# Patient Record
Sex: Female | Born: 1970 | Race: Asian | Hispanic: No | Marital: Married | Smoking: Never smoker
Health system: Southern US, Community
[De-identification: ages and names within clinical notes are randomized; demographics above are authoritative.]

## PROBLEM LIST (undated history)

## (undated) DIAGNOSIS — E8881 Metabolic syndrome: Secondary | ICD-10-CM

## (undated) DIAGNOSIS — D6861 Antiphospholipid syndrome: Secondary | ICD-10-CM

## (undated) DIAGNOSIS — O24419 Gestational diabetes mellitus in pregnancy, unspecified control: Secondary | ICD-10-CM

## (undated) DIAGNOSIS — IMO0001 Reserved for inherently not codable concepts without codable children: Secondary | ICD-10-CM

## (undated) DIAGNOSIS — N858 Other specified noninflammatory disorders of uterus: Secondary | ICD-10-CM

## (undated) DIAGNOSIS — N979 Female infertility, unspecified: Secondary | ICD-10-CM

## (undated) DIAGNOSIS — E88819 Insulin resistance, unspecified: Secondary | ICD-10-CM

## (undated) DIAGNOSIS — O039 Complete or unspecified spontaneous abortion without complication: Secondary | ICD-10-CM

## (undated) DIAGNOSIS — I10 Essential (primary) hypertension: Secondary | ICD-10-CM

## (undated) HISTORY — DX: Female infertility, unspecified: N97.9

## (undated) HISTORY — DX: Antiphospholipid syndrome: D68.61

## (undated) HISTORY — DX: Gestational diabetes mellitus in pregnancy, unspecified control: O24.419

## (undated) HISTORY — DX: Complete or unspecified spontaneous abortion without complication: O03.9

## (undated) HISTORY — DX: Essential (primary) hypertension: I10

## (undated) HISTORY — DX: Other specified noninflammatory disorders of uterus: N85.8

## (undated) HISTORY — DX: Metabolic syndrome: E88.81

## (undated) HISTORY — PX: OTHER SURGICAL HISTORY: SHX169

## (undated) HISTORY — DX: Insulin resistance, unspecified: E88.819

## (undated) SURGERY — Surgical Case
Anesthesia: Regional

---

## 2008-11-06 HISTORY — PX: DILATION AND CURETTAGE OF UTERUS: SHX78

## 2008-11-19 ENCOUNTER — Ambulatory Visit (HOSPITAL_COMMUNITY): Admission: RE | Admit: 2008-11-19 | Discharge: 2008-11-19 | Payer: Self-pay | Admitting: Obstetrics and Gynecology

## 2008-11-19 ENCOUNTER — Ambulatory Visit: Payer: Self-pay | Admitting: Vascular Surgery

## 2008-11-19 ENCOUNTER — Encounter (INDEPENDENT_AMBULATORY_CARE_PROVIDER_SITE_OTHER): Payer: Self-pay | Admitting: Obstetrics and Gynecology

## 2008-12-02 ENCOUNTER — Ambulatory Visit (HOSPITAL_COMMUNITY): Admission: RE | Admit: 2008-12-02 | Discharge: 2008-12-02 | Payer: Self-pay | Admitting: Obstetrics and Gynecology

## 2008-12-02 ENCOUNTER — Encounter (INDEPENDENT_AMBULATORY_CARE_PROVIDER_SITE_OTHER): Payer: Self-pay | Admitting: Obstetrics and Gynecology

## 2010-03-09 ENCOUNTER — Ambulatory Visit: Payer: Self-pay | Admitting: Cardiology

## 2010-04-28 ENCOUNTER — Ambulatory Visit: Payer: Self-pay | Admitting: Cardiology

## 2010-05-06 ENCOUNTER — Ambulatory Visit: Payer: Self-pay | Admitting: Oncology

## 2010-06-09 ENCOUNTER — Ambulatory Visit: Payer: Self-pay | Admitting: *Deleted

## 2010-06-16 ENCOUNTER — Other Ambulatory Visit (HOSPITAL_COMMUNITY): Payer: Self-pay | Admitting: Obstetrics and Gynecology

## 2010-06-16 DIAGNOSIS — N979 Female infertility, unspecified: Secondary | ICD-10-CM

## 2010-06-21 ENCOUNTER — Ambulatory Visit (HOSPITAL_COMMUNITY)
Admission: RE | Admit: 2010-06-21 | Discharge: 2010-06-21 | Disposition: A | Discharge status: Home / Self Care | Payer: PRIVATE HEALTH INSURANCE | Source: Ambulatory Visit | Attending: Obstetrics and Gynecology | Admitting: Obstetrics and Gynecology

## 2010-06-21 DIAGNOSIS — N96 Recurrent pregnancy loss: Secondary | ICD-10-CM | POA: Insufficient documentation

## 2010-06-21 DIAGNOSIS — N979 Female infertility, unspecified: Secondary | ICD-10-CM

## 2010-07-26 ENCOUNTER — Encounter (HOSPITAL_COMMUNITY)
Admission: RE | Admit: 2010-07-26 | Discharge: 2010-07-26 | Disposition: A | Discharge status: Home / Self Care | Payer: PRIVATE HEALTH INSURANCE | Source: Ambulatory Visit | Attending: Obstetrics and Gynecology | Admitting: Obstetrics and Gynecology

## 2010-07-26 DIAGNOSIS — Z01812 Encounter for preprocedural laboratory examination: Secondary | ICD-10-CM | POA: Insufficient documentation

## 2010-07-26 LAB — BASIC METABOLIC PANEL
Chloride: 104 mEq/L (ref 96–112)
Creatinine, Ser: 0.63 mg/dL (ref 0.4–1.2)
GFR calc Af Amer: >60 mL/min (ref >60)
Potassium: 3.7 mEq/L (ref 3.5–5.1)
Sodium: 137 mEq/L (ref 135–145)

## 2010-07-26 LAB — CBC
Hemoglobin: 13.9 g/dL (ref 12.0–15.0)
Platelets: 269 10*3/uL (ref 150–400)
RBC: 4.66 MIL/uL (ref 3.87–5.11)
WBC: 6.7 10*3/uL (ref 4.0–10.5)

## 2010-07-30 ENCOUNTER — Ambulatory Visit (HOSPITAL_COMMUNITY)
Admission: RE | Admit: 2010-07-30 | Discharge: 2010-07-30 | Disposition: A | Discharge status: Home / Self Care | Payer: PRIVATE HEALTH INSURANCE | Source: Ambulatory Visit | Attending: Obstetrics and Gynecology | Admitting: Obstetrics and Gynecology

## 2010-07-30 DIAGNOSIS — N840 Polyp of corpus uteri: Secondary | ICD-10-CM | POA: Insufficient documentation

## 2010-07-30 DIAGNOSIS — Q5128 Other doubling of uterus, other specified: Secondary | ICD-10-CM | POA: Insufficient documentation

## 2010-07-30 LAB — HCG, QUANTITATIVE, PREGNANCY: hCG, Beta Chain, Quant, S: <2 m[IU]/mL (ref <5)

## 2010-08-02 ENCOUNTER — Other Ambulatory Visit: Payer: Self-pay | Admitting: Obstetrics and Gynecology

## 2010-08-10 ENCOUNTER — Encounter: Payer: Self-pay | Admitting: Nurse Practitioner

## 2010-08-10 DIAGNOSIS — I10 Essential (primary) hypertension: Secondary | ICD-10-CM | POA: Insufficient documentation

## 2010-08-12 ENCOUNTER — Ambulatory Visit (INDEPENDENT_AMBULATORY_CARE_PROVIDER_SITE_OTHER): Payer: PRIVATE HEALTH INSURANCE | Admitting: Nurse Practitioner

## 2010-08-12 ENCOUNTER — Encounter: Payer: Self-pay | Admitting: Cardiology

## 2010-08-12 ENCOUNTER — Encounter: Payer: Self-pay | Admitting: Nurse Practitioner

## 2010-08-12 VITALS — BP 140/90 | HR 90 | Ht 60.0 in | Wt 120.8 lb

## 2010-08-12 DIAGNOSIS — I10 Essential (primary) hypertension: Secondary | ICD-10-CM

## 2010-08-12 DIAGNOSIS — Z79899 Other long term (current) drug therapy: Secondary | ICD-10-CM

## 2010-08-12 MED ORDER — LABETALOL HCL 100 MG PO TABS: 100.0000 mg | ORAL_TABLET | Freq: Every day | ORAL | Status: DC

## 2010-08-12 NOTE — Assessment & Plan Note (Signed)
Blood pressure is still not at goal. She is going to start IVF this month. I have discussed her case with Dr. Deborah Chalk. We will stop the HCTZ. We will place her on Labetolol 100mg  each day. I have encouraged regular exercise and sodium restriction. I will see her back in 1 month.

## 2010-08-12 NOTE — Progress Notes (Signed)
History of Present Illness: Alyssa Dennis is seen back today for a follow up visit. She is seen for Dr. Deborah Chalk. She has been feeling ok from our standpoint. She has had follow up with Dr. Gaylyn Rong. A repeat test showed that she was negative for the antiphospholipid antibody syndrome. There is no need for anticoagulation. She is going to proceed with IVF later this month. She needs to stop the HCTZ. Her blood pressure at home remains borderline. She is trying to watch her salt intake. She is exercising some.  No current outpatient prescriptions on file prior to visit.    No Known Allergies  Past Medical History  Diagnosis Date  . HTN (hypertension)   . Anti-cardiolipin antibody syndrome     Negative per retesting by Dr. Gaylyn Rong  . Miscarriage     x 2  . Uterine cyst   . Infertility, female     Past Surgical History  Procedure Date  . Dilation and curettage of uterus July 2010    History  Smoking status  . Never Smoker   Smokeless tobacco  . Never Used    History  Alcohol Use No    Family History  Problem Relation Age of Onset  . Hypertension Mother   . Hypertension Father     Review of Systems: The review of systems is positive for some dizziness after taking her meds.  All other systems were reviewed and are negative.  Physical Exam: BP 140/90  Pulse 90  Ht 5' (1.524 m)  Wt 120 lb 12.8 oz (54.795 kg)  BMI 23.59 kg/m2  LMP 08/01/2010 Patient is very pleasant and in no acute distress. Skin is warm and dry. Color is normal.  HEENT is unremarkable. Normocephalic/atraumatic. PERRL. Sclera are nonicteric. Neck is supple. No masses. No JVD. Lungs are clear. Cardiac exam shows a regular rate and rhythm. Abdomen is soft. Extremities are without edema. Gait and ROM are intact. No gross neurologic deficits noted.    Assessment / Plan:

## 2010-08-12 NOTE — Patient Instructions (Signed)
Stop the HCTZ We are going to start you on Labetolol 100mg  daily. Monitor your blood pressure at home.  Record your readings and bring to your next visit. Limit sodium intake. I will see you in one month.

## 2010-08-15 LAB — CBC
HCT: 39.3 % (ref 36.0–46.0)
Hemoglobin: 13.5 g/dL (ref 12.0–15.0)
MCHC: 34.4 g/dL (ref 30.0–36.0)
MCV: 89.6 fL (ref 78.0–100.0)
Platelets: 336 K/uL (ref 150–400)
RBC: 4.39 MIL/uL (ref 3.87–5.11)
RDW: 12.9 % (ref 11.5–15.5)
WBC: 8.4 K/uL (ref 4.0–10.5)

## 2010-08-17 ENCOUNTER — Telehealth: Payer: Self-pay | Admitting: Cardiology

## 2010-08-17 NOTE — Telephone Encounter (Signed)
CALL PT REGARDING HER BP MED AND SOME EFFECTS FROM THIS.

## 2010-08-23 ENCOUNTER — Telehealth: Payer: Self-pay | Admitting: Cardiology

## 2010-08-23 ENCOUNTER — Telehealth: Payer: Self-pay | Admitting: *Deleted

## 2010-08-23 NOTE — Telephone Encounter (Signed)
Calling back regarding her blood pressure;  She stopped it and is wondering if she needs another medicine?  Chart is on Dr. Molli Posey desk; pt was advised that he is out of the office and would be called tomorrow.

## 2010-08-23 NOTE — Telephone Encounter (Signed)
Returning a phone call. Please call back.

## 2010-08-23 NOTE — Telephone Encounter (Signed)
Left mess. For pt to call back regarding her call about her blood pressure medication

## 2010-08-24 ENCOUNTER — Other Ambulatory Visit: Payer: Self-pay | Admitting: *Deleted

## 2010-08-24 ENCOUNTER — Telehealth: Payer: Self-pay | Admitting: *Deleted

## 2010-08-24 DIAGNOSIS — I10 Essential (primary) hypertension: Secondary | ICD-10-CM

## 2010-08-24 MED ORDER — METHYLDOPA 250 MG PO TABS: 250.0000 mg | ORAL_TABLET | Freq: Two times a day (BID) | ORAL | Status: DC

## 2010-08-24 NOTE — Telephone Encounter (Signed)
Pt called, was unable to take Labetalol due to itching that the medication caused.  Dr. Deborah Chalk suggested pt monitor BP for a few days and call back with results.  Pt called back BP am 134/90 in the pm 145/98.   Pt is still having fertility treatments.  Dr. Deborah Chalk notified and instructed RN to have pt try Aldomet 250 mg BID.  Pt notified and verbalized to RN understanding of instructions.

## 2010-09-06 ENCOUNTER — Encounter: Payer: Self-pay | Admitting: Nurse Practitioner

## 2010-09-13 ENCOUNTER — Ambulatory Visit (INDEPENDENT_AMBULATORY_CARE_PROVIDER_SITE_OTHER): Payer: PRIVATE HEALTH INSURANCE | Admitting: Nurse Practitioner

## 2010-09-13 ENCOUNTER — Encounter: Payer: Self-pay | Admitting: Nurse Practitioner

## 2010-09-13 VITALS — BP 120/80 | HR 100 | Wt 122.0 lb

## 2010-09-13 DIAGNOSIS — I10 Essential (primary) hypertension: Secondary | ICD-10-CM

## 2010-09-13 NOTE — Assessment & Plan Note (Signed)
Her blood pressure looks better to me. I don't think she will tolerate an increase in her medicines and for now I don't think she needs it. Her blood pressure comes back down with her second dose of the Aldomet. She will continue to monitor her blood pressure at home. I will have her see Dr. Antoine Poche for a visit in about 3 months in light of Dr. Deborah Chalk retiring next month. We will see where we are with the IVF and if she is pregnant. Hopefully we could defer further management to primary care after her pregnancy.  Patient is agreeable to this plan and will call if any problems develop in the interim.

## 2010-09-13 NOTE — Progress Notes (Signed)
   Alyssa Dennis Date of Birth: 1971/02/24   History of Present Illness: Alyssa Dennis is seen today for a follow up visit. It is a 4 week check. She is seen for Dr. Deborah Chalk. She is doing well. She did not tolerate Labetolol. She had itching. She has been switched to Aldomet. She is doing IVF treatments to try and get pregnant. She is doing ok. She is trying to watch her salt. Her blood pressure at home is better with the Aldomet. She notes that her readings to trend up in the afternoon but come back down with her second dose of medicine. She has had readings as low as 110 systolic.   Current Outpatient Prescriptions on File Prior to Visit  Medication Sig Dispense Refill  . aspirin 81 MG tablet Take 81 mg by mouth daily.        . metFORMIN (GLUCOPHAGE-XR) 750 MG 24 hr tablet Take 750 mg by mouth daily with breakfast.        . methyldopa (ALDOMET) 250 MG tablet Take 1 tablet (250 mg total) by mouth 2 (two) times daily.  60 tablet  3  . Multiple Vitamin (MULTIVITAMIN) tablet Take 1 tablet by mouth daily.        Marland Kitchen DISCONTD: labetalol (NORMODYNE) 100 MG tablet Take 1 tablet (100 mg total) by mouth daily.  30 tablet  11    Allergies  Allergen Reactions  . Labetalol Itching    Past Medical History  Diagnosis Date  . HTN (hypertension)   . Anti-cardiolipin antibody syndrome     Negative per retesting by Dr. Gaylyn Rong  . Miscarriage     x 2  . Uterine cyst   . Infertility, female     Past Surgical History  Procedure Date  . Dilation and curettage of uterus July 2010    History  Smoking status  . Never Smoker   Smokeless tobacco  . Never Used    History  Alcohol Use No    Family History  Problem Relation Age of Onset  . Hypertension Mother   . Hypertension Father     Review of Systems: The review of systems is positive for occasional back pain. She does not have chest pain. She is not short of breath.  All other systems were reviewed and are negative.  Physical Exam: BP  120/80  Pulse 100  Wt 122 lb (55.339 kg) Patient is very pleasant and in no acute distress. Skin is warm and dry. Color is normal.  HEENT is unremarkable. Normocephalic/atraumatic. PERRL. Sclera are nonicteric. Neck is supple. No masses. No JVD. Lungs are clear. Cardiac exam shows a regular rate and rhythm. Abdomen is soft. Extremities are without edema. Gait and ROM are intact. No gross neurologic deficits noted.  LABORATORY DATA:  N/A   Assessment / Plan:

## 2010-09-13 NOTE — Patient Instructions (Signed)
Continue with your current medicines. Monitor your blood pressure at home.  Record your readings and bring to your next visit. Limit sodium intake. Call for any problems. I will have you see Dr. Antoine Poche in about 3 months.

## 2010-09-21 NOTE — Op Note (Signed)
[name], [name]       ACCOUNT NO.:  [id number]   MEDICAL RECORD NO.:  [id number]          PATIENT TYPE:  AMB   LOCATION:  [facility]                           FACILITY:  [facility]   PHYSICIAN:  Lenoard Aden, M.D.DATE OF BIRTH:  July 10, 1970   DATE OF PROCEDURE:  12/02/2008  DATE OF DISCHARGE:  12/02/2008                               OPERATIVE REPORT   PREOPERATIVE DIAGNOSIS:  Missed abortion at 7 weeks.   POSTOPERATIVE DIAGNOSIS:  Missed abortion at 7 weeks.   PROCEDURE:  Suction, dilatation and evacuation.   SURGEON:  Lenoard Aden, MD   ANESTHESIA:  MAC paracervical.   ESTIMATED BLOOD LOSS:  Less than 50 mL.   COMPLICATIONS:  None.   DRAINS:  None.   COUNTS:  Correct.  The patient recovery in good condition.   BRIEF OPERATIVE NOTE:  After being apprised of risks of anesthesia,  infection, bleeding, injury to abdominal organs, need for repair,  delayed versus immediate complications to include bowel and bladder  injury, the patient was brought to the operating room where she was  administered IV sedation without difficulty, prepped and draped in usual  sterile fashion, catheterized until the bladder was empty.  Exam under  anesthesia reveals an anteflexed uterus and no adnexal masses.  Cervix  was easily dilated up to [facility] dilator after placement of dilute  paracervical block with 20 mL total.  At this time, the suction curette  was placed.  Visualization and aspiration in a 4 quadrant method reveals  products of conception.  Repeat curettage in a 4 quadrant method and  suction reveals the cavity to be empty.  Good hemostasis noted.  All  instruments removed.  The patient tolerates the procedure well and went  to recovery in good condition.      Lenoard Aden, M.D.  Electronically Signed     RJT/MEDQ  D:  12/02/2008  T:  12/03/2008  Job:  [id number]

## 2010-10-15 ENCOUNTER — Telehealth: Payer: Self-pay | Admitting: *Deleted

## 2010-10-15 NOTE — Telephone Encounter (Signed)
Pt called requesting a note to take to school stating pt needed rest due to hypertension.  Pt is currently pregnant and on Aldomet for hypertension.  Dr. Deborah Chalk notified and instructed RN to have pt get note from OB/GYN.  Pt notified of Dr. Ronnald Nian instructions and verbalized to RN understanding of instructions.

## 2010-11-04 ENCOUNTER — Inpatient Hospital Stay (HOSPITAL_COMMUNITY)
Admission: AD | Admit: 2010-11-04 | Payer: PRIVATE HEALTH INSURANCE | Source: Ambulatory Visit | Admitting: Obstetrics and Gynecology

## 2010-11-04 LAB — GC/CHLAMYDIA PROBE AMP, GENITAL: Chlamydia: NEGATIVE

## 2010-11-08 LAB — HIV ANTIBODY (ROUTINE TESTING W REFLEX): HIV: NONREACTIVE

## 2010-11-08 LAB — RPR: RPR: NONREACTIVE

## 2010-11-08 LAB — ANTIBODY SCREEN: Antibody Screen: NEGATIVE

## 2010-11-08 LAB — HEPATITIS B SURFACE ANTIGEN: Hepatitis B Surface Ag: NEGATIVE

## 2010-12-10 NOTE — Op Note (Signed)
  NAMEErion Dennis, Alyssa Dennis       ACCOUNT NO.:  [id number]  MEDICAL RECORD NO.:  [id number]           PATIENT TYPE:  O  LOCATION:  [facility]                          [facility]  PHYSICIAN:  Fermin Schwab, MD   DATE OF BIRTH:  1970/10/10  DATE OF PROCEDURE:  07/30/2010 DATE OF DISCHARGE:                              OPERATIVE REPORT   PREOPERATIVE DIAGNOSES:  Endometrial polyp and partial uterine septum.  POSTOPERATIVE DIAGNOSES:  Endometrial polyp and partial uterine septum.  PROCEDURES:  Hysteroscopy, incision of uterine septum, and polypectomy.  SURGEON:  Fermin Schwab, MD  ANESTHESIA:  General.  SPECIMENS:  Endometrial polyp to Pathology.  COMPLICATIONS:  None.  ESTIMATED BLOOD LOSS:  Minimal.  DESCRIPTION OF FINDINGS:  On hysteroscopy, the endocervical canal was normal.  The endometrial cavity appears to be sharply anteverted and anteflexed.  The endometrial cavity contained a polypoid ridge on the right mid uterine cavity, was measured 0.5 x 1 cm.  There were no other polyps.  The uterine fundus appears to have a wide septum that came down about 1.2 cm.  DESCRIPTION OF PROCEDURE:  The patient was placed in dorsal lithotomy position.  General anesthesia was given, 1 gram of Kefzol was given intravenously for prophylaxis.  She was prepped and draped in sterile manner.  A dilute vasopressin solution was injected into the cervix after the bladder was straight catheterized.  A 30-degree standard hysteroscope was inserted with some difficulty through the cervical canal into the endometrial cavity.  Above findings were noted.  The distention solution was normal.  Saline distention method was hysteroscopic fluid management system.  Using hysteroscopic scissors, first the polypoid tissue on the right side of the uterus was excised.  Next, the partial septum was incised for about 1 cm.  Excellent hemostasis was achieved.  Instruments were removed.  The pad count was  correct.  Estimated blood loss was minimal. The patient tolerated the procedure well and was transferred to recovery room in satisfactory condition.     Fermin Schwab, MD     TY/MEDQ  D:  07/30/2010  T:  07/31/2010  Job:  [id number]  Electronically Signed by Fermin Schwab MD on 12/10/2010 04:11:06 PM

## 2010-12-21 ENCOUNTER — Ambulatory Visit (INDEPENDENT_AMBULATORY_CARE_PROVIDER_SITE_OTHER): Payer: PRIVATE HEALTH INSURANCE | Admitting: Cardiology

## 2010-12-21 ENCOUNTER — Ambulatory Visit: Payer: PRIVATE HEALTH INSURANCE | Admitting: Cardiology

## 2010-12-21 ENCOUNTER — Encounter: Payer: Self-pay | Admitting: Cardiology

## 2010-12-21 VITALS — BP 118/66 | HR 94 | Resp 16 | Ht 59.0 in | Wt 121.0 lb

## 2010-12-21 DIAGNOSIS — I10 Essential (primary) hypertension: Secondary | ICD-10-CM

## 2010-12-21 MED ORDER — METHYLDOPA 250 MG PO TABS: 250.0000 mg | ORAL_TABLET | Freq: Two times a day (BID) | ORAL | Status: DC

## 2010-12-21 NOTE — Patient Instructions (Signed)
The current medical regimen is effective;  continue present plan and medications.  Follow up with Dr Hochrein in 4 months. 

## 2010-12-21 NOTE — Progress Notes (Signed)
HPI The patient presents for followup of hypertension. She is now 4 months pregnant. On her current regimen her blood pressure has been well-controlled. There have been rare systolics above 140. She is feeling well. The patient denies any new symptoms such as chest discomfort, neck or arm discomfort. There has been no new shortness of breath, PND or orthopnea. There have been no reported palpitations, presyncope or syncope.   Allergies  Allergen Reactions  . Labetalol Itching    Current Outpatient Prescriptions  Medication Sig Dispense Refill  . aspirin 81 MG tablet Take 81 mg by mouth daily.        . metFORMIN (GLUCOPHAGE-XR) 750 MG 24 hr tablet Take 750 mg by mouth daily with breakfast.        . methyldopa (ALDOMET) 250 MG tablet Take 1 tablet (250 mg total) by mouth 2 (two) times daily.  60 tablet  3  . Multiple Vitamin (MULTIVITAMIN) tablet Take 1 tablet by mouth daily.          Past Medical History  Diagnosis Date  . HTN (hypertension)   . Anti-cardiolipin antibody syndrome     Negative per retesting by Dr. Gaylyn Rong  . Miscarriage     x 2  . Uterine cyst   . Infertility, female     Past Surgical History  Procedure Date  . Dilation and curettage of uterus July 2010    ROS:  As stated in the HPI and negative for all other systems.  PHYSICAL EXAM BP 118/66  Pulse 94  Resp 16  Ht 4\' 11"  (1.499 m)  Wt 121 lb (54.885 kg)  BMI 24.44 kg/m2 GENERAL:  Well appearing HEENT:  Pupils equal round and reactive, fundi not visualized, oral mucosa unremarkable NECK:  No jugular venous distention, waveform within normal limits, carotid upstroke brisk and symmetric, no bruits, no thyromegaly LYMPHATICS:  No cervical, inguinal adenopathy LUNGS:  Clear to auscultation bilaterally BACK:  No CVA tenderness CHEST:  Unremarkable HEART:  PMI not displaced or sustained,S1 and S2 within normal limits, no S3, no S4, no clicks, no rubs, no murmurs ABD:  Flat, positive bowel sounds normal in  frequency in pitch, no bruits, no rebound, no guarding, no midline pulsatile mass, no hepatomegaly, no splenomegaly, gravid EXT:  2 plus pulses throughout, no edema, no cyanosis no clubbing SKIN:  No rashes no nodules NEURO:  Cranial nerves II through XII grossly intact, motor grossly intact throughout PSYCH:  Cognitively intact, oriented to person place and time   EKG:  Sinus rhythm, right axis, rate 94, no acute ST-T wave changes  ASSESSMENT AND PLAN

## 2010-12-21 NOTE — Assessment & Plan Note (Signed)
At this point her blood pressure is well controlled on Methyldopa.  No change in therapy is indicated.  We talked about a low salt diet.  No further work up is indicated.  She will let me know if her blood pressure starts to increase.

## 2011-03-08 ENCOUNTER — Encounter: Payer: PRIVATE HEALTH INSURANCE | Attending: Obstetrics and Gynecology | Admitting: Dietician

## 2011-03-08 ENCOUNTER — Encounter: Payer: Self-pay | Admitting: Dietician

## 2011-03-08 DIAGNOSIS — O99810 Abnormal glucose complicating pregnancy: Secondary | ICD-10-CM | POA: Insufficient documentation

## 2011-03-08 DIAGNOSIS — Z713 Dietary counseling and surveillance: Secondary | ICD-10-CM | POA: Insufficient documentation

## 2011-03-08 NOTE — Progress Notes (Signed)
  Patient was seen on 03/08/2011 for Gestational Diabetes self-management class at the [facility]. The following learning objectives were met by the patient during this course:   States the definition of Gestational Diabetes  States why dietary management is important in controlling blood glucose  Describes the effects each nutrient has on blood glucose levels  Demonstrates ability to create a balanced meal plan  Demonstrates carbohydrate counting   States when to check blood glucose levels  Demonstrates proper blood glucose monitoring techniques  States the effect of stress and exercise on blood glucose levels  States the importance of limiting caffeine and abstaining from alcohol and smoking  Blood glucose monitor given: Accu-Chek [employer] Kit Lot # [id number] Exp: 05/08/2012 Blood glucose reading: 79 Note that 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  I introduced them to the Reli- On meter from [employer] as an alternative to the Accu-Chek.  Given they give a history of a limited budget, the Reli-On meter and supplies Alyssa Dennis cost less than the co-pay for the Accu-Chek.  Patient instructed to monitor glucose levels:Fasting and 2 hours after first bite of each meal. FBS: 60 - <90 1 hour: <140 2 hour: <120  *Patient received handouts:  Nutrition Diabetes and Pregnancy  Carbohydrate Counting List  Patient will be seen for follow-up as needed.

## 2011-04-22 ENCOUNTER — Ambulatory Visit: Payer: PRIVATE HEALTH INSURANCE | Admitting: Cardiology

## 2011-04-25 ENCOUNTER — Other Ambulatory Visit: Payer: Self-pay | Admitting: Cardiology

## 2011-04-25 DIAGNOSIS — I10 Essential (primary) hypertension: Secondary | ICD-10-CM

## 2011-04-25 MED ORDER — METHYLDOPA 250 MG PO TABS: 250.0000 mg | ORAL_TABLET | Freq: Two times a day (BID) | ORAL | Status: DC

## 2011-04-29 ENCOUNTER — Encounter: Payer: Self-pay | Admitting: Cardiology

## 2011-04-29 ENCOUNTER — Ambulatory Visit (INDEPENDENT_AMBULATORY_CARE_PROVIDER_SITE_OTHER): Payer: PRIVATE HEALTH INSURANCE | Admitting: Cardiology

## 2011-04-29 VITALS — BP 108/68 | HR 80 | Ht 59.0 in | Wt 142.8 lb

## 2011-04-29 DIAGNOSIS — I10 Essential (primary) hypertension: Secondary | ICD-10-CM

## 2011-04-29 NOTE — Patient Instructions (Signed)
Continue current medications as listed.  Follow up with Dr Antoine Poche in 8 weeks

## 2011-04-29 NOTE — Assessment & Plan Note (Signed)
Her blood pressure is controlled. Before she takes her medications her systolics are in the 1:30 range. Therefore, I think she needs to continue with the current dose. I did tell her that after delivery I would be happy to see her back to readjust medications in case her blood pressure is lower or higher. Also I would probably not use methyldopa long-term.

## 2011-04-29 NOTE — Progress Notes (Signed)
   HPI The patient presents for followup of hypertension. She is now [redacted] weeks pregnant. Her meds are as listed. Her blood pressure has been controlled. It does fluctuate from systolics of 108-130. She's not had any presyncope or syncope. She's had no chest pressure, neck or arm discomfort. She's had no shortness of breath, PND or orthopnea. She's had no new weight gain or edema other than associated with pregnancy. Her blood sugars have been elevated.   Allergies  Allergen Reactions  . Labetalol Itching    Current Outpatient Prescriptions  Medication Sig Dispense Refill  . metFORMIN (GLUCOPHAGE-XR) 750 MG 24 hr tablet Take 750 mg by mouth daily with breakfast.        . methyldopa (ALDOMET) 250 MG tablet Take 1 tablet (250 mg total) by mouth 2 (two) times daily.  60 tablet  3  . Prenatal Vit-Fe Fumarate-FA (PRENATAL VITAMINS PLUS PO) Take 1 tablet by mouth daily.          Past Medical History  Diagnosis Date  . HTN (hypertension)   . Anti-cardiolipin antibody syndrome     Negative per retesting by Dr. Gaylyn Rong  . Miscarriage     x 2  . Uterine cyst   . Infertility, female     Past Surgical History  Procedure Date  . Dilation and curettage of uterus July 2010    ROS:  As stated in the HPI and negative for all other systems.  PHYSICAL EXAM BP 108/68  Pulse 80  Ht 4\' 11"  (1.499 m)  Wt 142 lb 12.8 oz (64.774 kg)  BMI 28.84 kg/m2  LMP 08/01/2010 GENERAL:  Well appearing HEENT:  Pupils equal round and reactive, fundi not visualized, oral mucosa unremarkable NECK:  No jugular venous distention, waveform within normal limits, carotid upstroke brisk and symmetric, no bruits, no thyromegaly LYMPHATICS:  No cervical, inguinal adenopathy LUNGS:  Clear to auscultation bilaterally BACK:  No CVA tenderness CHEST:  Unremarkable HEART:  PMI not displaced or sustained,S1 and S2 within normal limits, no S3, no S4, no clicks, no rubs, no murmurs ABD:  Flat, positive bowel sounds normal in  frequency in pitch, no bruits, no rebound, no guarding, no midline pulsatile mass, no hepatomegaly, no splenomegaly, gravid (exam is compromised by this) EXT:  2 plus pulses throughout, no edema, no cyanosis no clubbing SKIN:  No rashes no nodules NEURO:  Cranial nerves II through XII grossly intact, motor grossly intact throughout PSYCH:  Cognitively intact, oriented to person place and time  ASSESSMENT AND PLAN

## 2011-05-20 ENCOUNTER — Telehealth (HOSPITAL_COMMUNITY): Payer: Self-pay | Admitting: *Deleted

## 2011-05-20 ENCOUNTER — Encounter (HOSPITAL_COMMUNITY): Payer: Self-pay | Admitting: *Deleted

## 2011-05-20 NOTE — Telephone Encounter (Signed)
Preadmission screen  

## 2011-05-26 ENCOUNTER — Telehealth (HOSPITAL_COMMUNITY): Payer: Self-pay | Admitting: *Deleted

## 2011-05-26 NOTE — Telephone Encounter (Signed)
Preadmission screen  

## 2011-05-27 ENCOUNTER — Inpatient Hospital Stay (HOSPITAL_COMMUNITY)
Admission: RE | Admit: 2011-05-27 | Discharge: 2011-05-31 | DRG: 765 | Disposition: A | Discharge status: Home / Self Care | Payer: PRIVATE HEALTH INSURANCE | Source: Ambulatory Visit | Attending: Obstetrics and Gynecology | Admitting: Obstetrics and Gynecology

## 2011-05-27 ENCOUNTER — Encounter (HOSPITAL_COMMUNITY): Payer: Self-pay

## 2011-05-27 DIAGNOSIS — O365990 Maternal care for other known or suspected poor fetal growth, unspecified trimester, not applicable or unspecified: Secondary | ICD-10-CM | POA: Diagnosis present

## 2011-05-27 DIAGNOSIS — IMO0001 Reserved for inherently not codable concepts without codable children: Secondary | ICD-10-CM

## 2011-05-27 DIAGNOSIS — O09519 Supervision of elderly primigravida, unspecified trimester: Secondary | ICD-10-CM | POA: Diagnosis present

## 2011-05-27 DIAGNOSIS — O1002 Pre-existing essential hypertension complicating childbirth: Secondary | ICD-10-CM | POA: Diagnosis present

## 2011-05-27 DIAGNOSIS — I10 Essential (primary) hypertension: Secondary | ICD-10-CM

## 2011-05-27 DIAGNOSIS — O99814 Abnormal glucose complicating childbirth: Secondary | ICD-10-CM | POA: Diagnosis present

## 2011-05-27 HISTORY — DX: Reserved for inherently not codable concepts without codable children: IMO0001

## 2011-05-27 LAB — CBC
HCT: 36.9 % (ref 36.0–46.0)
Hemoglobin: 13 g/dL (ref 12.0–15.0)
MCV: 89.1 fL (ref 78.0–100.0)
Platelets: 197 10*3/uL (ref 150–400)
RBC: 4.14 MIL/uL (ref 3.87–5.11)
WBC: 8.6 10*3/uL (ref 4.0–10.5)

## 2011-05-27 LAB — COMPREHENSIVE METABOLIC PANEL
AST: 17 U/L (ref 0–37)
Albumin: 2.9 g/dL — ABNORMAL LOW (ref 3.5–5.2)
Alkaline Phosphatase: 115 U/L (ref 39–117)
BUN: 10 mg/dL (ref 6–23)
CO2: 24 mEq/L (ref 19–32)
Chloride: 102 mEq/L (ref 96–112)
Potassium: 3.9 mEq/L (ref 3.5–5.1)
Total Bilirubin: 0.2 mg/dL — ABNORMAL LOW (ref 0.3–1.2)

## 2011-05-27 MED ORDER — DINOPROSTONE 10 MG VA INST
10.0000 mg | VAGINAL_INSERT | Freq: Once | VAGINAL | Status: AC
  Administered 2011-05-27: 10 mg via VAGINAL
  Filled 2011-05-27: qty 1

## 2011-05-27 MED ORDER — ZOLPIDEM TARTRATE 10 MG PO TABS: 10.0000 mg | ORAL_TABLET | Freq: Every evening | ORAL | Status: DC | PRN

## 2011-05-27 MED ORDER — TERBUTALINE SULFATE 1 MG/ML IJ SOLN: 0.2500 mg | Freq: Once | INTRAMUSCULAR | Status: AC | PRN

## 2011-05-27 MED ORDER — ACETAMINOPHEN 325 MG PO TABS
650.0000 mg | ORAL_TABLET | ORAL | Status: DC | PRN
Start: 2011-05-27 — End: 2011-05-28

## 2011-05-27 MED ORDER — OXYTOCIN BOLUS FROM INFUSION
500.0000 mL | Freq: Once | INTRAVENOUS | Status: DC
  Filled 2011-05-27: qty 500

## 2011-05-27 MED ORDER — OXYCODONE-ACETAMINOPHEN 5-325 MG PO TABS: 2.0000 | ORAL_TABLET | ORAL | Status: DC | PRN

## 2011-05-27 MED ORDER — METHYLDOPA 250 MG PO TABS
250.0000 mg | ORAL_TABLET | Freq: Two times a day (BID) | ORAL | Status: DC
  Administered 2011-05-28 – 2011-05-29 (×2): 250 mg via ORAL
  Filled 2011-05-27 (×3): qty 1

## 2011-05-27 MED ORDER — OXYTOCIN 20 UNITS IN LACTATED RINGERS INFUSION - SIMPLE
1.0000 m[IU]/min | INTRAVENOUS | Status: DC
  Administered 2011-05-28: 2 m[IU]/min via INTRAVENOUS
  Filled 2011-05-27: qty 1000

## 2011-05-27 MED ORDER — FLEET ENEMA 7-19 GM/118ML RE ENEM: 1.0000 | ENEMA | RECTAL | Status: DC | PRN

## 2011-05-27 MED ORDER — LACTATED RINGERS IV SOLN
500.0000 mL | INTRAVENOUS | Status: DC | PRN
  Administered 2011-05-28: 1000 mL via INTRAVENOUS

## 2011-05-27 MED ORDER — LIDOCAINE HCL (PF) 1 % IJ SOLN: 30.0000 mL | INTRAMUSCULAR | Status: DC | PRN

## 2011-05-27 MED ORDER — CITRIC ACID-SODIUM CITRATE 334-500 MG/5ML PO SOLN
30.0000 mL | ORAL | Status: DC | PRN
  Administered 2011-05-28: 30 mL via ORAL
  Filled 2011-05-27: qty 15

## 2011-05-27 MED ORDER — IBUPROFEN 600 MG PO TABS: 600.0000 mg | ORAL_TABLET | Freq: Four times a day (QID) | ORAL | Status: DC | PRN

## 2011-05-27 MED ORDER — OXYTOCIN 20 UNITS IN LACTATED RINGERS INFUSION - SIMPLE: 125.0000 mL/h | Freq: Once | INTRAVENOUS | Status: DC

## 2011-05-27 MED ORDER — LACTATED RINGERS IV SOLN
INTRAVENOUS | Status: DC
  Administered 2011-05-27: 125 mL/h via INTRAVENOUS

## 2011-05-27 MED ORDER — ONDANSETRON HCL 4 MG/2ML IJ SOLN: 4.0000 mg | Freq: Four times a day (QID) | INTRAMUSCULAR | Status: DC | PRN

## 2011-05-27 NOTE — Progress Notes (Signed)
Alyssa Dennis is a 41 y.o. G3P0020 at [redacted]w[redacted]d by LMP admitted for induction of labor due to Gestational diabetes, Hypertension and Poor fetal growth.  Subjective: No complaints  Objective: BP 139/89  Pulse 111  Temp(Src) 97.7 F (36.5 C) (Oral)  Resp 18  Ht 4\' 11"  (1.499 m)  Wt 67.586 kg (149 lb)  BMI 30.09 kg/m2  LMP 08/01/2010      FHT:  FHR: 155 bpm, variability: moderate,  accelerations:  Present,  decelerations:  Absent UC:   none SVE:    closed/60/-1 Cervidil placed  Labs: Lab Results  Component Value Date   WBC 8.6 05/27/2011   HGB 13.0 05/27/2011   HCT 36.9 05/27/2011   MCV 89.1 05/27/2011   PLT 197 05/27/2011    Assessment / Plan: 39 weeks CHTN IUGR GDM on Metformin  Labor: Cervidil tonight and Pitocin in am Preeclampsia:  labs stable and pending Fetal Wellbeing:  Category I Pain Control:  Labor support without medications I/D:  n/a Anticipated MOD:  Attempted SVD, likely Cesarean section  Khalon Cansler J 05/27/2011, 9:24 PM

## 2011-05-27 NOTE — Progress Notes (Signed)
MD will be at the bedside to see pt.

## 2011-05-27 NOTE — H&P (Signed)
NAMEMarzelle, Rutten Albertia       ACCOUNT NO.:  [id number]  MEDICAL RECORD NO.:  [id number]  LOCATION:  9173                          FACILITY:  [facility]  PHYSICIAN:  Lenoard Aden, M.D.DATE OF BIRTH:  1970-08-19  DATE OF ADMISSION:  05/27/2011 DATE OF DISCHARGE:                             HISTORY & PHYSICAL   CHIEF COMPLAINT:  IUGR and chronic hypertension, for induction of labor.  HISTORY OF PRESENT ILLNESS:  She is a 41 year old [place] female, G1, P0, at 38-6/[redacted] weeks gestation who presents for cervical ripening, and attempts at vaginal delivery due to aforementioned indications.  She has allergies to labetalol.  MEDICATIONS:  Prenatal vitamins, Aldomet, metformin.  She is a nonsmoker, nondrinker.  Denies domestic or physical violence.  She has a personal medical history of previously diagnosed borderline anticardiolipin antibody, but otherwise negative workup, a history of infertility, and infused with fertility assistance.  She has a history of hypertension and is on __________ noted.  Family history, which is noncontributory.  PREGNANCY HISTORY:  Contributory for an SAB and a missed AB, one with D and C, prenatal course complicated by size and date discrepancy.  Growth restriction with estimated fetal weight under the 10th percentile and chronic hypertension well controlled on Aldomet and gestational diabetes, diet and medication controlled on metformin.  PHYSICAL EXAMINATION:  GENERAL:  She is a well-developed, well-nourished [place] female in no acute distress. HEENT:  Normal. NECK:  Supple.  Full range of motion. LUNGS:  Clear. HEART:  Regular rhythm. ABDOMEN:  Soft, gravid, nontender.  Estimated fetal weight 5-1/2 to 6 pounds.  Cervix is closed, 50%, vertex, -1, -2. EXTREMITIES:  There are no cords. NEUROLOGIC:  Nonfocal. SKIN:  Intact.  LABORATORY DATA:  Pending.  NST is pending.  Cervidil pending.  IMPRESSION: 1. A 38-6/7 week intrauterine pregnancy. 2.  Asymmetric intrauterine growth restriction. 3. Gestational diabetes, on metformin. 4. Chronic hypertension, on Aldomet.  PLAN:  Admit Cervidil.  Continue Aldomet.  Check CBG q.6.  Check CMP, CBC, anticipate cautious attempts at vaginal delivery.     Lenoard Aden, M.D.     RJT/MEDQ  D:  05/27/2011  T:  05/27/2011  Job:  [id number]

## 2011-05-28 ENCOUNTER — Encounter (HOSPITAL_COMMUNITY): Payer: Self-pay

## 2011-05-28 ENCOUNTER — Encounter (HOSPITAL_COMMUNITY)
Admission: RE | Disposition: A | Discharge status: Home / Self Care | Payer: Self-pay | Source: Ambulatory Visit | Attending: Obstetrics and Gynecology

## 2011-05-28 ENCOUNTER — Inpatient Hospital Stay (HOSPITAL_COMMUNITY): Payer: PRIVATE HEALTH INSURANCE | Admitting: Anesthesiology

## 2011-05-28 ENCOUNTER — Other Ambulatory Visit: Payer: Self-pay | Admitting: Obstetrics and Gynecology

## 2011-05-28 ENCOUNTER — Encounter (HOSPITAL_COMMUNITY): Payer: Self-pay | Admitting: Anesthesiology

## 2011-05-28 DIAGNOSIS — IMO0001 Reserved for inherently not codable concepts without codable children: Secondary | ICD-10-CM

## 2011-05-28 HISTORY — DX: Reserved for inherently not codable concepts without codable children: IMO0001

## 2011-05-28 LAB — GLUCOSE, CAPILLARY
Glucose-Capillary: 89 mg/dL (ref 70–99)
Glucose-Capillary: 73 mg/dL (ref 70–99)

## 2011-05-28 SURGERY — Surgical Case
Anesthesia: Spinal | Site: Abdomen | Wound class: Clean Contaminated

## 2011-05-28 MED ORDER — KETOROLAC TROMETHAMINE 30 MG/ML IJ SOLN
30.0000 mg | Freq: Four times a day (QID) | INTRAMUSCULAR | Status: AC | PRN
  Filled 2011-05-28: qty 1

## 2011-05-28 MED ORDER — WITCH HAZEL-GLYCERIN EX PADS: 1.0000 "application " | MEDICATED_PAD | CUTANEOUS | Status: DC | PRN

## 2011-05-28 MED ORDER — ZOLPIDEM TARTRATE 5 MG PO TABS: 5.0000 mg | ORAL_TABLET | Freq: Every evening | ORAL | Status: DC | PRN

## 2011-05-28 MED ORDER — KETOROLAC TROMETHAMINE 60 MG/2ML IM SOLN
60.0000 mg | Freq: Once | INTRAMUSCULAR | Status: AC | PRN
  Administered 2011-05-28: 60 mg via INTRAMUSCULAR

## 2011-05-28 MED ORDER — KETOROLAC TROMETHAMINE 30 MG/ML IJ SOLN: 30.0000 mg | Freq: Four times a day (QID) | INTRAMUSCULAR | Status: AC | PRN

## 2011-05-28 MED ORDER — IBUPROFEN 600 MG PO TABS
600.0000 mg | ORAL_TABLET | Freq: Four times a day (QID) | ORAL | Status: DC | PRN
  Administered 2011-05-31: 600 mg via ORAL
  Filled 2011-05-28 (×6): qty 1

## 2011-05-28 MED ORDER — MORPHINE SULFATE (PF) 0.5 MG/ML IJ SOLN
INTRAMUSCULAR | Status: DC | PRN
  Administered 2011-05-28: .1 mg via EPIDURAL

## 2011-05-28 MED ORDER — ONDANSETRON HCL 4 MG/2ML IJ SOLN
INTRAMUSCULAR | Status: AC
  Filled 2011-05-28: qty 2

## 2011-05-28 MED ORDER — ONDANSETRON HCL 4 MG/2ML IJ SOLN: 4.0000 mg | INTRAMUSCULAR | Status: DC | PRN

## 2011-05-28 MED ORDER — DIPHENHYDRAMINE HCL 25 MG PO CAPS: 25.0000 mg | ORAL_CAPSULE | Freq: Four times a day (QID) | ORAL | Status: DC | PRN

## 2011-05-28 MED ORDER — ONDANSETRON HCL 4 MG/2ML IJ SOLN
INTRAMUSCULAR | Status: DC | PRN
  Administered 2011-05-28: 4 mg via INTRAVENOUS

## 2011-05-28 MED ORDER — METHYLDOPA 250 MG PO TABS
250.0000 mg | ORAL_TABLET | Freq: Two times a day (BID) | ORAL | Status: DC
  Administered 2011-05-29 – 2011-05-31 (×5): 250 mg via ORAL
  Filled 2011-05-28 (×5): qty 1

## 2011-05-28 MED ORDER — TETANUS-DIPHTH-ACELL PERTUSSIS 5-2.5-18.5 LF-MCG/0.5 IM SUSP: 0.5000 mL | Freq: Once | INTRAMUSCULAR | Status: DC

## 2011-05-28 MED ORDER — PROMETHAZINE HCL 25 MG/ML IJ SOLN: 6.2500 mg | INTRAMUSCULAR | Status: DC | PRN

## 2011-05-28 MED ORDER — SCOPOLAMINE 1 MG/3DAYS TD PT72
MEDICATED_PATCH | TRANSDERMAL | Status: AC
  Administered 2011-05-28: 1.5 mg via TRANSDERMAL
  Filled 2011-05-28: qty 1

## 2011-05-28 MED ORDER — SIMETHICONE 80 MG PO CHEW
80.0000 mg | CHEWABLE_TABLET | Freq: Three times a day (TID) | ORAL | Status: DC
  Administered 2011-05-28 – 2011-05-31 (×9): 80 mg via ORAL

## 2011-05-28 MED ORDER — OXYTOCIN 20 UNITS IN LACTATED RINGERS INFUSION - SIMPLE
125.0000 mL/h | INTRAVENOUS | Status: DC
  Administered 2011-05-28: 125 mL/h via INTRAVENOUS

## 2011-05-28 MED ORDER — PHENYLEPHRINE 40 MCG/ML (10ML) SYRINGE FOR IV PUSH (FOR BLOOD PRESSURE SUPPORT)
PREFILLED_SYRINGE | INTRAVENOUS | Status: AC
  Filled 2011-05-28: qty 10

## 2011-05-28 MED ORDER — SENNOSIDES-DOCUSATE SODIUM 8.6-50 MG PO TABS
2.0000 | ORAL_TABLET | Freq: Every day | ORAL | Status: DC
  Administered 2011-05-28 – 2011-05-30 (×3): 2 via ORAL

## 2011-05-28 MED ORDER — MORPHINE SULFATE 0.5 MG/ML IJ SOLN
INTRAMUSCULAR | Status: AC
  Filled 2011-05-28: qty 10

## 2011-05-28 MED ORDER — HYDROMORPHONE HCL PF 1 MG/ML IJ SOLN
INTRAMUSCULAR | Status: AC
  Filled 2011-05-28: qty 1

## 2011-05-28 MED ORDER — DIBUCAINE 1 % RE OINT: 1.0000 "application " | TOPICAL_OINTMENT | RECTAL | Status: DC | PRN

## 2011-05-28 MED ORDER — PRENATAL MULTIVITAMIN CH
1.0000 | ORAL_TABLET | Freq: Every day | ORAL | Status: DC
  Administered 2011-05-29 – 2011-05-31 (×4): 1 via ORAL
  Filled 2011-05-28 (×3): qty 1

## 2011-05-28 MED ORDER — FENTANYL CITRATE 0.05 MG/ML IJ SOLN
INTRAMUSCULAR | Status: AC
  Filled 2011-05-28: qty 2

## 2011-05-28 MED ORDER — OXYCODONE-ACETAMINOPHEN 5-325 MG PO TABS
1.0000 | ORAL_TABLET | ORAL | Status: DC | PRN
  Administered 2011-05-29 – 2011-05-30 (×3): 1 via ORAL
  Filled 2011-05-28 (×3): qty 1

## 2011-05-28 MED ORDER — PHENYLEPHRINE 40 MCG/ML (10ML) SYRINGE FOR IV PUSH (FOR BLOOD PRESSURE SUPPORT)
PREFILLED_SYRINGE | INTRAVENOUS | Status: AC
  Filled 2011-05-28: qty 5

## 2011-05-28 MED ORDER — MEPERIDINE HCL 25 MG/ML IJ SOLN: 6.2500 mg | INTRAMUSCULAR | Status: DC | PRN

## 2011-05-28 MED ORDER — OXYTOCIN 10 UNIT/ML IJ SOLN
40.0000 [IU] | INTRAVENOUS | Status: DC | PRN
  Administered 2011-05-28: 40 [IU] via INTRAVENOUS

## 2011-05-28 MED ORDER — KETOROLAC TROMETHAMINE 60 MG/2ML IM SOLN
INTRAMUSCULAR | Status: AC
  Administered 2011-05-28: 60 mg via INTRAMUSCULAR
  Filled 2011-05-28: qty 2

## 2011-05-28 MED ORDER — NALBUPHINE HCL 10 MG/ML IJ SOLN
5.0000 mg | INTRAMUSCULAR | Status: DC | PRN
  Filled 2011-05-28: qty 1

## 2011-05-28 MED ORDER — FENTANYL CITRATE 0.05 MG/ML IJ SOLN
INTRAMUSCULAR | Status: DC | PRN
  Administered 2011-05-28: 12.5 ug via INTRATHECAL

## 2011-05-28 MED ORDER — OXYTOCIN 20 UNITS IN LACTATED RINGERS INFUSION - SIMPLE
INTRAVENOUS | Status: AC
  Filled 2011-05-28: qty 1000

## 2011-05-28 MED ORDER — IBUPROFEN 600 MG PO TABS
600.0000 mg | ORAL_TABLET | Freq: Four times a day (QID) | ORAL | Status: DC
  Administered 2011-05-29 – 2011-05-30 (×8): 600 mg via ORAL
  Filled 2011-05-28 (×3): qty 1

## 2011-05-28 MED ORDER — KETOROLAC TROMETHAMINE 30 MG/ML IJ SOLN: 15.0000 mg | Freq: Once | INTRAMUSCULAR | Status: DC | PRN

## 2011-05-28 MED ORDER — ONDANSETRON HCL 4 MG/2ML IJ SOLN: 4.0000 mg | Freq: Three times a day (TID) | INTRAMUSCULAR | Status: DC | PRN

## 2011-05-28 MED ORDER — ONDANSETRON HCL 4 MG PO TABS: 4.0000 mg | ORAL_TABLET | ORAL | Status: DC | PRN

## 2011-05-28 MED ORDER — LACTATED RINGERS IV SOLN
INTRAVENOUS | Status: DC
  Administered 2011-05-29: 03:00:00 via INTRAVENOUS

## 2011-05-28 MED ORDER — DIPHENHYDRAMINE HCL 50 MG/ML IJ SOLN: 25.0000 mg | INTRAMUSCULAR | Status: DC | PRN

## 2011-05-28 MED ORDER — PHENYLEPHRINE HCL 10 MG/ML IJ SOLN
INTRAMUSCULAR | Status: DC | PRN
  Administered 2011-05-28: 40 ug via INTRAVENOUS
  Administered 2011-05-28 (×3): 80 ug via INTRAVENOUS
  Administered 2011-05-28: 120 ug via INTRAVENOUS
  Administered 2011-05-28: 40 ug via INTRAVENOUS
  Administered 2011-05-28 (×2): 80 ug via INTRAVENOUS

## 2011-05-28 MED ORDER — METFORMIN HCL ER 750 MG PO TB24
750.0000 mg | ORAL_TABLET | Freq: Every day | ORAL | Status: DC
Start: 2011-05-29 — End: 2011-05-31
  Administered 2011-05-29 – 2011-05-31 (×3): 750 mg via ORAL
  Filled 2011-05-28 (×3): qty 1

## 2011-05-28 MED ORDER — LANOLIN HYDROUS EX OINT: 1.0000 "application " | TOPICAL_OINTMENT | CUTANEOUS | Status: DC | PRN

## 2011-05-28 MED ORDER — SCOPOLAMINE 1 MG/3DAYS TD PT72
1.0000 | MEDICATED_PATCH | Freq: Once | TRANSDERMAL | Status: DC
  Administered 2011-05-28: 1.5 mg via TRANSDERMAL

## 2011-05-28 MED ORDER — CEFAZOLIN SODIUM 1-5 GM-% IV SOLN
INTRAVENOUS | Status: DC | PRN
  Administered 2011-05-28: 1 g via INTRAVENOUS

## 2011-05-28 MED ORDER — BUPIVACAINE HCL (PF) 0.25 % IJ SOLN
INTRAMUSCULAR | Status: DC | PRN
  Administered 2011-05-28: 7 mL

## 2011-05-28 MED ORDER — NALOXONE HCL 0.4 MG/ML IJ SOLN: 0.4000 mg | INTRAMUSCULAR | Status: DC | PRN

## 2011-05-28 MED ORDER — MENTHOL 3 MG MT LOZG: 1.0000 | LOZENGE | OROMUCOSAL | Status: DC | PRN

## 2011-05-28 MED ORDER — SODIUM CHLORIDE 0.9 % IJ SOLN: 3.0000 mL | INTRAMUSCULAR | Status: DC | PRN

## 2011-05-28 MED ORDER — OXYTOCIN 10 UNIT/ML IJ SOLN
INTRAMUSCULAR | Status: AC
  Filled 2011-05-28: qty 4

## 2011-05-28 MED ORDER — SODIUM CHLORIDE 0.9 % IR SOLN
Status: DC | PRN
  Administered 2011-05-28: 1000 mL

## 2011-05-28 MED ORDER — HYDROMORPHONE HCL PF 1 MG/ML IJ SOLN
0.2500 mg | INTRAMUSCULAR | Status: DC | PRN
  Administered 2011-05-28: 0.5 mg via INTRAVENOUS

## 2011-05-28 MED ORDER — DIPHENHYDRAMINE HCL 25 MG PO CAPS: 25.0000 mg | ORAL_CAPSULE | ORAL | Status: DC | PRN

## 2011-05-28 MED ORDER — LACTATED RINGERS IV SOLN
INTRAVENOUS | Status: DC | PRN
  Administered 2011-05-28 (×2): via INTRAVENOUS

## 2011-05-28 MED ORDER — SIMETHICONE 80 MG PO CHEW: 80.0000 mg | CHEWABLE_TABLET | ORAL | Status: DC | PRN

## 2011-05-28 MED ORDER — BUPIVACAINE IN DEXTROSE 0.75-8.25 % IT SOLN
INTRATHECAL | Status: DC | PRN
  Administered 2011-05-28: 1.2 mL via INTRATHECAL

## 2011-05-28 MED ORDER — DIPHENHYDRAMINE HCL 50 MG/ML IJ SOLN: 12.5000 mg | INTRAMUSCULAR | Status: DC | PRN

## 2011-05-28 MED ORDER — BUPIVACAINE HCL (PF) 0.25 % IJ SOLN
INTRAMUSCULAR | Status: AC
  Filled 2011-05-28: qty 30

## 2011-05-28 MED ORDER — SODIUM CHLORIDE 0.9 % IV SOLN
1.0000 ug/kg/h | INTRAVENOUS | Status: DC | PRN
  Filled 2011-05-28: qty 2.5

## 2011-05-28 SURGICAL SUPPLY — 31 items
CLOTH BEACON ORANGE TIMEOUT ST (SAFETY) ×2 IMPLANT
CONTAINER PREFILL 10% NBF 15ML (MISCELLANEOUS) IMPLANT
DRESSING TELFA 8X3 (GAUZE/BANDAGES/DRESSINGS) IMPLANT
DRSG COVADERM 4X10 (GAUZE/BANDAGES/DRESSINGS) ×2 IMPLANT
DRSG PAD ABDOMINAL 8X10 ST (GAUZE/BANDAGES/DRESSINGS) ×2 IMPLANT
ELECT REM PT RETURN 9FT ADLT (ELECTROSURGICAL) ×2
ELECTRODE REM PT RTRN 9FT ADLT (ELECTROSURGICAL) ×1 IMPLANT
EXTRACTOR VACUUM M CUP 4 TUBE (SUCTIONS) IMPLANT
GAUZE SPONGE 4X4 12PLY STRL LF (GAUZE/BANDAGES/DRESSINGS) IMPLANT
GLOVE BIO SURGEON STRL SZ7.5 (GLOVE) ×4 IMPLANT
GOWN PREVENTION PLUS LG XLONG (DISPOSABLE) ×4 IMPLANT
GOWN PREVENTION PLUS XLARGE (GOWN DISPOSABLE) ×2 IMPLANT
KIT ABG SYR 3ML LUER SLIP (SYRINGE) IMPLANT
NEEDLE HYPO 25X1 1.5 SAFETY (NEEDLE) ×2 IMPLANT
NEEDLE HYPO 25X5/8 SAFETYGLIDE (NEEDLE) IMPLANT
NS IRRIG 1000ML POUR BTL (IV SOLUTION) ×2 IMPLANT
PACK C SECTION WH (CUSTOM PROCEDURE TRAY) ×2 IMPLANT
PAD ABD 7.5X8 STRL (GAUZE/BANDAGES/DRESSINGS) ×2 IMPLANT
SLEEVE SCD COMPRESS KNEE MED (MISCELLANEOUS) ×2 IMPLANT
STAPLER VISISTAT 35W (STAPLE) ×2 IMPLANT
SUT MNCRL 0 VIOLET CTX 36 (SUTURE) ×2 IMPLANT
SUT MON AB 2-0 CT1 27 (SUTURE) ×2 IMPLANT
SUT MON AB-0 CT1 36 (SUTURE) ×4 IMPLANT
SUT MONOCRYL 0 CTX 36 (SUTURE) ×2
SUT PLAIN 0 NONE (SUTURE) IMPLANT
SUT PLAIN 2 0 XLH (SUTURE) IMPLANT
SYR CONTROL 10ML LL (SYRINGE) ×2 IMPLANT
TAPE CLOTH SURG 4X10 WHT LF (GAUZE/BANDAGES/DRESSINGS) ×2 IMPLANT
TOWEL OR 17X24 6PK STRL BLUE (TOWEL DISPOSABLE) ×4 IMPLANT
TRAY FOLEY CATH 14FR (SET/KITS/TRAYS/PACK) ×2 IMPLANT
WATER STERILE IRR 1000ML POUR (IV SOLUTION) ×2 IMPLANT

## 2011-05-28 NOTE — Anesthesia Postprocedure Evaluation (Signed)
Anesthesia Post Note  Patient: Alyssa Dennis  Procedure(s) Performed:  CESAREAN SECTION - Primary cesarean section with delivery of baby boy at 59. Apgars 9/9.  Anesthesia type: Spinal  Patient location: PACU  Post pain: Pain level controlled  Post assessment: Post-op Vital signs reviewed  Last Vitals:  Filed Vitals:   05/28/11 1840  BP:   Pulse: 80  Temp:   Resp: 16    Post vital signs: Reviewed  Level of consciousness: awake  Complications: No apparent anesthesia complications

## 2011-05-28 NOTE — Transfer of Care (Signed)
Immediate Anesthesia Transfer of Care Note  Patient: Alyssa Dennis  Procedure(s) Performed:  CESAREAN SECTION - Primary cesarean section with delivery of baby boy at 46. Apgars 9/9.  Patient Location: PACU  Anesthesia Type: Spinal  Level of Consciousness: awake, alert  and oriented  Airway & Oxygen Therapy: Patient Spontanous Breathing  Post-op Assessment: Report given to PACU RN and Post -op Vital signs reviewed and stable  Post vital signs: stable  Complications: No apparent anesthesia complications

## 2011-05-28 NOTE — Anesthesia Procedure Notes (Signed)
Spinal  Patient location during procedure: OR Start time: 05/28/2011 5:20 PM End time: 05/28/2011 5:22 PM Staffing Anesthesiologist: Sandrea Hughs Performed by: anesthesiologist  Preanesthetic Checklist Completed: patient identified, site marked, surgical consent, pre-op evaluation, timeout performed, IV checked, risks and benefits discussed and monitors and equipment checked Spinal Block Patient position: sitting Prep: DuraPrep Patient monitoring: heart rate, cardiac monitor, continuous pulse ox and blood pressure Approach: midline Location: L3-4 Injection technique: single-shot Needle Needle type: Sprotte  Needle gauge: 24 G Needle length: 9 cm Needle insertion depth: 4 cm Assessment Sensory level: T4

## 2011-05-28 NOTE — Op Note (Signed)
Cesarean Section Procedure Note  Indications: non-reassuring fetal status and CHTN,IUGR,DM  Pre-operative Diagnosis: 39 week 0 day pregnancy.  Post-operative Diagnosis: same  Surgeon: Lenoard Aden   Assistants: Renae Fickle , CNM  Anesthesia: Spinal anesthesia  ASA Class: 2  Procedure Details  The patient was seen in the Holding Room. The risks, benefits, complications, treatment options, and expected outcomes were discussed with the patient.  The patient concurred with the proposed plan, giving informed consent. The risks of anesthesia, infection, bleeding and possible injury to other organs discussed. Injury to bowel, bladder, or ureter with possible need for repair discussed. Possible need for transfusion with secondary risks of hepatitis or HIV acquisition discussed. Post operative complications to include but not limited to DVT, PE and Pneumonia noted. The site of surgery properly noted/marked. The patient was taken to Operating Room # 1, identified as Alyssa Dennis and the procedure verified as C-Section Delivery. A Time Out was held and the above information confirmed.  After induction of anesthesia, the patient was draped and prepped in the usual sterile manner. A Pfannenstiel incision was made and carried down through the subcutaneous tissue to the fascia. Fascial incision was made and extended transversely using Mayo scissors. The fascia was separated from the underlying rectus tissue superiorly and inferiorly. The peritoneum was identified and entered. Peritoneal incision was extended longitudinally. The utero-vesical peritoneal reflection was incised transversely and the bladder flap was bluntly freed from the lower uterine segment. A low transverse uterine incision(Kerr hysterotomy) was made. Delivered from vtx presentation was a  female with Apgar scores of 9 at one minute and 9 at five minutes. After the umbilical cord was clamped and cut cord blood was obtained for evaluation.  The placenta was removed intact and appeared normal. The uterine outline, tubes and ovaries appeared normal. The uterine incision was closed with running locked sutures of 0 Monocryl x 2 layers. Hemostasis was observed. Lavage was carried out until clear. The fascia was then reapproximated with running sutures of 0 Monocryl. The skin was reapproximated with staples.  Instrument, sponge, and needle counts were correct prior the abdominal closure and at the conclusion of the case.   Findings: above  Estimated Blood Loss:  300 mL         Drains: foley                 Specimens: placenta to pathology                 Complications:  None; patient tolerated the procedure well.         Disposition: PACU - hemodynamically stable.         Condition: stable  Attending Attestation: I performed the procedure.

## 2011-05-28 NOTE — Progress Notes (Signed)
Alyssa Dennis is a 41 y.o. G3P0020 at [redacted]w[redacted]d by LMP admitted for induction of labor due to Diabetes, Hypertension and Poor fetal growth.  Subjective: Feel contractions.  Objective: BP 117/75  Pulse 101  Temp(Src) 98.6 F (37 C) (Oral)  Resp 18  Ht 4\' 11"  (1.499 m)  Wt 67.586 kg (149 lb)  BMI 30.09 kg/m2  LMP 08/01/2010      FHT:  FHR: 140-150 bpm, variability: minimal ,  accelerations:  Present,  decelerations:  Absent(rare)UC:   regular, every 3 minutes SVE:   Dilation: Fingertip Effacement (%): 50 Station: Ballotable Exam by:: Alyssa Dennis Unable to AROM  Labs: Lab Results  Component Value Date   WBC 8.6 05/27/2011   HGB 13.0 05/27/2011   HCT 36.9 05/27/2011   MCV 89.1 05/27/2011   PLT 197 05/27/2011    Assessment / Plan: IUGR CHTN DM(Metformin)- BS 95 Ballotable Vertex Occ Decels, not repetitive  Labor: NO progress Preeclampsia:  na Fetal Wellbeing:  Category I Pain Control:  Labor support without medications I/D:  n/a Anticipated MOD:  Options discussed. Given medical conditions and fetal concerns, we have discussed option of primary csection. RIsks vs benefits noted. Pt and husband considering options.  Alyssa Dennis Alyssa Dennis 05/28/2011, 11:53 AM

## 2011-05-28 NOTE — Progress Notes (Signed)
Lindsea Olivar is a 41 y.o. G3P0020 at [redacted]w[redacted]d by LMP admitted for induction of labor due to Diabetes, Hypertension and Poor fetal growth.  Subjective: Getting uncomfortable  Objective: BP 116/73  Pulse 100  Temp(Src) 98.2 F (36.8 C) (Oral)  Resp 18  Ht 4\' 11"  (1.499 m)  Wt 67.586 kg (149 lb)  BMI 30.09 kg/m2  LMP 08/01/2010      FHT:  FHR: 140-150 bpm, variability: minimal ,  accelerations:  Abscent,  decelerations:  Present intermittent late decelerations UC:   regular, every 3 minutes SVE:   Cl/thick / -2  Labs: Lab Results  Component Value Date   WBC 8.6 05/27/2011   HGB 13.0 05/27/2011   HCT 36.9 05/27/2011   MCV 89.1 05/27/2011   PLT 197 05/27/2011    Assessment / Plan: IUGR CHTN DM on Metformin Non reassuring FHR tracomg  Labor: no progress on Pitocin x 8 hours Preeclampsia:  na Fetal Wellbeing:  Category II Pain Control:  Labor support without medications I/D:  n/a Anticipated MOD:  Proceed with cesarean section. Risks vs benefits discussed with patient. OR called for urgent ceserean section.  Frederica Chrestman J 05/28/2011, 4:54 PM

## 2011-05-28 NOTE — OR Nursing (Signed)
Uterus massaged by S. Velva Molinari RN.  Two tubes of cord blood to lab.  

## 2011-05-28 NOTE — Progress Notes (Signed)
Alyssa Dennis is a 41 y.o. G3P0020 at [redacted]w[redacted]d by LMP admitted for induction of labor due to Gestational diabetes, Hypertension and Poor fetal growth.  Subjective:   Objective: BP 107/72  Pulse 96  Temp(Src) 97.7 F (36.5 C) (Oral)  Resp 16  Ht 4\' 11"  (1.499 m)  Wt 67.586 kg (149 lb)  BMI 30.09 kg/m2  LMP 08/01/2010      FHT:  FHR: 145 bpm, variability: minimal ,  accelerations:  Present,  decelerations:  Absent UC:   irregular, every 2-7 minutes SVE:   Dilation: Fingertip Effacement (%): 50 Station: -3 Exam by:: [employer] RN  Labs: Lab Results  Component Value Date   WBC 8.6 05/27/2011   HGB 13.0 05/27/2011   HCT 36.9 05/27/2011   MCV 89.1 05/27/2011   PLT 197 05/27/2011    Assessment / Plan: Induction of labor due to IUGR, gestational hypertension and gestational diabetes,  progressing well on pitocin  Labor: Progressing normally Preeclampsia:  na Fetal Wellbeing:  Category I Pain Control:  Labor support without medications I/D:  n/a Anticipated MOD:  Likely Cesarean section Minimal BTBV , watch FHR closely.  Ava Deguire J 05/28/2011, 8:48 AM

## 2011-05-28 NOTE — Anesthesia Preprocedure Evaluation (Addendum)
Anesthesia Evaluation  Patient identified by MRN, date of birth, ID band Patient awake    Reviewed: Allergy & Precautions, H&P , NPO status , Patient's Chart, lab work & pertinent test results  Airway Mallampati: I      Dental No notable dental hx.    Pulmonary    Pulmonary exam normal       Cardiovascular hypertension, Pt. on medications     Neuro/Psych    GI/Hepatic negative GI ROS, Neg liver ROS,   Endo/Other    Renal/GU negative Renal ROS  Genitourinary negative   Musculoskeletal negative musculoskeletal ROS (+)   Abdominal Normal abdominal exam  (+)   Peds negative pediatric ROS (+)  Hematology negative hematology ROS (+)   Anesthesia Other Findings   Reproductive/Obstetrics (+) Pregnancy                           Anesthesia Physical Anesthesia Plan  ASA: II  Anesthesia Plan: Spinal   Post-op Pain Management:    Induction:   Airway Management Planned:   Additional Equipment:   Intra-op Plan:   Post-operative Plan:   Informed Consent: I have reviewed the patients History and Physical, chart, labs and discussed the procedure including the risks, benefits and alternatives for the proposed anesthesia with the patient or authorized representative who has indicated his/her understanding and acceptance.     Plan Discussed with: CRNA  Anesthesia Plan Comments:         Anesthesia Quick Evaluation

## 2011-05-29 ENCOUNTER — Encounter (HOSPITAL_COMMUNITY): Payer: Self-pay

## 2011-05-29 LAB — CBC
HCT: 28 % — ABNORMAL LOW (ref 36.0–46.0)
Hemoglobin: 9.9 g/dL — ABNORMAL LOW (ref 12.0–15.0)
RBC: 3.11 MIL/uL — ABNORMAL LOW (ref 3.87–5.11)
WBC: 14.1 10*3/uL — ABNORMAL HIGH (ref 4.0–10.5)

## 2011-05-29 MED ORDER — BISACODYL 10 MG RE SUPP
10.0000 mg | Freq: Once | RECTAL | Status: AC
  Administered 2011-05-29: 10 mg via RECTAL
  Filled 2011-05-29: qty 1

## 2011-05-29 MED ORDER — INFLUENZA VIRUS VACC SPLIT PF IM SUSP
0.5000 mL | Freq: Once | INTRAMUSCULAR | Status: DC
  Filled 2011-05-29: qty 0.5

## 2011-05-29 MED ORDER — INFLUENZA VIRUS VACC SPLIT PF IM SUSP: 0.5000 mL | Freq: Once | INTRAMUSCULAR | Status: DC

## 2011-05-29 NOTE — Anesthesia Postprocedure Evaluation (Signed)
  Anesthesia Post-op Note  Patient: Alyssa Dennis  Procedure(s) Performed:  CESAREAN SECTION  Patient Location: Mother/Baby  Anesthesia Type: Epidural  Level of Consciousness: awake, alert  and oriented  Airway and Oxygen Therapy: Patient Spontanous Breathing  Post-op Pain: mild  Post-op Assessment: Patient's Cardiovascular Status Stable, Respiratory Function Stable, Patent Airway, No signs of Nausea or vomiting and Pain level controlled  Post-op Vital Signs: stable  Complications: No apparent anesthesia complications

## 2011-05-29 NOTE — Progress Notes (Signed)
Subjective: POD# 1 Information for the patient's newborn:  Alyssa, Dennis [[id number]]  female  / circ declined  Reports feeling well, a bit sore at incision site Feeding: breast Patient reports tolerating PO.  Breast symptoms: none Pain controlled withmotrin, and just starting percocet Denies HA/SOB/C/P/N/V/dizziness. Flatus present. She reports vaginal bleeding as normal, without clots.  She ambulated x 1, foley cath still in place.    Objective:   VS:  Filed Vitals:   05/29/11 0230 05/29/11 0300 05/29/11 0415 05/29/11 0540  BP: 103/68   102/66  Pulse: 81   80  Temp: 98.5 F (36.9 C)   98.4 F (36.9 C)  TempSrc: Oral   Oral  Resp: 18 18 16 18   Height:      Weight:      SpO2: 94% 93% 95% 95%     Intake/Output Summary (Last 24 hours) at 05/29/11 1015 Last data filed at 05/29/11 0540  Gross per 24 hour  Intake   1500 ml  Output   1400 ml  Net    100 ml        Basename 05/29/11 0525 05/27/11 2037  WBC 14.1* 8.6  HGB 9.9* 13.0  HCT 28.0* 36.9  PLT 160 197   CBG (last 3)   Basename 05/28/11 1822 05/28/11 1507 05/28/11 0911  GLUCAP 73 111* 95      Blood type: O/Positive/-- (07/02 0000)  Rubella: Immune (07/02 0000)     Physical Exam:  General: alert, cooperative and no distress CV: Regular rate and rhythm Resp: clear Abdomen: soft, nontender, normal bowel sounds Incision: clean, dry, intact and Dressing in place Uterine Fundus: firm, below umbilicus, nontender Lochia: minimal Ext: Homans sign is negative, no sign of DVT and no edema, redness or tenderness in the calves or thighs      Assessment/Plan: 41 y.o.  status post Cesarean section. POD# 1.  s/p Cesarean Delivery.  Indications: failure to progress                GDM - BS stable on metformin cHTN - stable on aldomet D/C foley  Ambulate Routine post-op care  Alyssa Dennis 05/29/2011, 10:15 AM

## 2011-05-30 ENCOUNTER — Encounter (HOSPITAL_COMMUNITY): Payer: Self-pay

## 2011-05-30 NOTE — Progress Notes (Signed)
Subjective: POD# 2 Information for the patient's newborn:  Ellorie, Kindall [[id number]]  female  / circ declined  Reports feeling tired but well. Feeding: breast Patient reports tolerating PO.  Breast symptoms: none Pain controlled with Motrin and Percocet. Denies HA/SOB/C/P/N/V/dizziness. Flatus present. She reports vaginal bleeding as normal, without clots.  She is ambulating, urinating without difficult.     Objective:   VS:  Filed Vitals:   05/29/11 1340 05/29/11 1803 05/29/11 2110 05/30/11 0620  BP: 122/74 131/82 121/75 104/70  Pulse: 89 97 92 91  Temp: 100.3 F (37.9 C) 98.3 F (36.8 C) 98.5 F (36.9 C) 97.9 F (36.6 C)  TempSrc: Oral Axillary Oral Oral  Resp: 20 20 20 20   Height:      Weight:      SpO2: 96% 99%       Intake/Output Summary (Last 24 hours) at 05/30/11 0939 Last data filed at 05/29/11 1809  Gross per 24 hour  Intake   1355 ml  Output   1470 ml  Net   -115 ml        Basename 05/29/11 0525 05/27/11 2037  WBC 14.1* 8.6  HGB 9.9* 13.0  HCT 28.0* 36.9  PLT 160 197     Blood type: O/Positive/-- (07/02 0000)  Rubella: Immune (07/02 0000)     Physical Exam:  General: alert, cooperative and no distress CV: Regular rate and rhythm Resp: clear Abdomen: soft, nontender, normal bowel sounds, (+) mild gas distension Incision: clean, dry, intact and staples in place Uterine Fundus: firm, below umbilicus, nontender Lochia: minimal Ext: Homans sign is negative, no sign of DVT and no edema, redness or tenderness in the calves or thighs      Assessment/Plan: 41 y.o.   POD# 2.             Principal Problem:  *PP care - s/p 1C/S 1/19, failure to progress  Doing well, stable post-op     GDM A2 delivered and pre-existing impaired glucose metabolism- stable on Metformin, continue current management per Dr. Loletta Specter - stable on Aldomet, continue present mgmt.  Advance routine post-op orders Ambulate and warm PO fluids for increased  gut motility Anticipate discharge home in AM.   PAUL,DANIELA 05/30/2011, 9:39 AM

## 2011-05-31 ENCOUNTER — Encounter (HOSPITAL_COMMUNITY): Payer: Self-pay | Admitting: Obstetrics and Gynecology

## 2011-05-31 MED ORDER — OXYCODONE-ACETAMINOPHEN 5-325 MG PO TABS: 1.0000 | ORAL_TABLET | Freq: Four times a day (QID) | ORAL | Status: AC | PRN

## 2011-05-31 MED ORDER — IBUPROFEN 600 MG PO TABS: 600.0000 mg | ORAL_TABLET | Freq: Four times a day (QID) | ORAL | Status: AC | PRN

## 2011-05-31 NOTE — Progress Notes (Signed)
Patient ID: Alyssa Dennis, female   DOB: October 19, 1970, 41 y.o.   MRN: [id number] POD # 3  Subjective: Pt reports feeling "sore", but well and desires d/c home/ Pain controlled with prescription NSAID's including motrin and narcotic analgesics including percocet Tolerating po/Voiding without problems/ No n/v/Flatus pos Activity: walk in hall Bleeding is light Newborn info:  Information for the patient's newborn:  Shatonia, Hoots [[id number]]  female  / circ declined/ Feeding: breast   Objective: VS: Blood pressure 123/86, pulse 92, temperature 97.7 F (36.5 C), temperature source Oral, resp. rate 20   Physical Exam:  General: alert, cooperative and no distress CV: Regular rate and rhythm Resp: clear Abdomen: soft, nontender, normal bowel sounds; mod dependent edema Incision: healing well, well approximated Uterine Fundus: firm, below umbilicus, nontender Lochia: minimal Ext: edema +2 pedal and pretib and Homans sign is negative, no sign of DVT    A/P: POD # 3/ [id number] S/P Primary C/S Failure to descend Hx C HTN and PCOS; cont aldomet and metformin Doing well and stable for discharge home Percocet 5/325 1 - 2 tabs po every 6 hrs prn pain  #30 No refill Ibuprofen 600mg  po Q 6 hrs prn pain #30 Refill x 1 BP check in office Tues 06/07/11 @ 1100 Remove staples and apply benzoin and steri strips before d/c home   Signed: Arlana Lindau, [facility] 05/31/11 0945

## 2011-05-31 NOTE — Discharge Summary (Signed)
Obstetric Discharge Summary Reason for Admission: induction of labor and Asymmetric IUGR, GDMA2 and pre-existing impaired glucose metabolism, cHTN Prenatal Procedures: NST and ultrasound Intrapartum Procedures: cesarean: low cervical, transverse Postpartum Procedures: none Complications-Operative and Postpartum: none Hemoglobin  Date Value Range Status  05/29/2011 9.9* 12.0-15.0 (g/dL) Final     DELTA CHECK NOTED     REPEATED TO VERIFY     HCT  Date Value Range Status  05/29/2011 28.0* 36.0-46.0 (%) Final   Discharge Diagnoses: Term Pregnancy-delivered  Discharge Information: Date: 05/31/2011 Activity: pelvic rest Diet: routine Medications: PNV, Ibuprofen and Percocet; con't metformin and aldomet Condition: stable Instructions: refer to practice specific booklet Discharge to: home   Newborn Data: Live born female on 05/28/11 Birth Weight: 6 lb 4.9 oz (2860 g) APGAR: 9, 9  Home with mother.  BP check in the office on 06/07/11 @ 1100  Darrold Bezek K 05/31/2011, 9:46 AM

## 2011-06-22 ENCOUNTER — Ambulatory Visit: Payer: PRIVATE HEALTH INSURANCE | Admitting: Cardiology

## 2011-07-07 ENCOUNTER — Ambulatory Visit: Payer: PRIVATE HEALTH INSURANCE | Admitting: Cardiology

## 2011-07-25 ENCOUNTER — Encounter: Payer: Self-pay | Admitting: Cardiology

## 2011-07-25 ENCOUNTER — Ambulatory Visit (INDEPENDENT_AMBULATORY_CARE_PROVIDER_SITE_OTHER): Payer: PRIVATE HEALTH INSURANCE | Admitting: Cardiology

## 2011-07-25 VITALS — BP 148/102 | HR 65 | Ht 59.0 in | Wt 129.0 lb

## 2011-07-25 DIAGNOSIS — I10 Essential (primary) hypertension: Secondary | ICD-10-CM

## 2011-07-25 MED ORDER — NIFEDIPINE ER OSMOTIC RELEASE 30 MG PO TB24: 30.0000 mg | ORAL_TABLET | Freq: Every day | ORAL | Status: AC

## 2011-07-25 NOTE — Progress Notes (Signed)
   HPI The patient presents for followup of hypertension. She is now post partem having had a little boy in Jan.  Since that time her blood pressure has been elevated as it is today.  She feels well.  The patient denies any new symptoms such as chest discomfort, neck or arm discomfort. There has been no new shortness of breath, PND or orthopnea. There have been no reported palpitations, presyncope or syncope.  She has had no edema.  She is somewhat sleep deprived.   Allergies  Allergen Reactions  . Labetalol Itching    Current Outpatient Prescriptions  Medication Sig Dispense Refill  . metFORMIN (GLUCOPHAGE-XR) 750 MG 24 hr tablet Take 750 mg by mouth daily with breakfast.        . methyldopa (ALDOMET) 250 MG tablet Take 1 tablet (250 mg total) by mouth 2 (two) times daily.  60 tablet  3  . Prenatal Vit-Fe Fumarate-FA (PRENATAL VITAMINS PLUS PO) Take 1 tablet by mouth daily.          Past Medical History  Diagnosis Date  . HTN (hypertension)   . Anti-cardiolipin antibody syndrome     Negative per retesting by Dr. Gaylyn Rong  . Miscarriage     x 2  . Uterine cyst   . Infertility, female   . Insulin resistance   . Active labor 05/28/2011  . PP care - s/p 1C/S 1/19, failure to progress 05/30/2011    Past Surgical History  Procedure Date  . Dilation and curettage of uterus July 2010  . Endometrial polyp   . Cesarean section 05/28/2011    Procedure: CESAREAN SECTION;  Surgeon: Lenoard Aden, MD;  Location: [facility];  Service: Gynecology;  Laterality: N/A;  Primary cesarean section with delivery of baby boy at 77. Apgars 9/9.    ROS:  As stated in the HPI and negative for all other systems.  PHYSICAL EXAM BP 148/102  Pulse 65  Ht 4\' 11"  (1.499 m)  Wt 129 lb (58.514 kg)  BMI 26.05 kg/m2  LMP 08/01/2010 GENERAL:  Well appearing HEENT:  Pupils equal round and reactive, fundi not visualized, oral mucosa unremarkable NECK:  No jugular venous distention, waveform within normal limits,  carotid upstroke brisk and symmetric, no bruits, no thyromegaly LYMPHATICS:  No cervical, inguinal adenopathy LUNGS:  Clear to auscultation bilaterally BACK:  No CVA tenderness CHEST:  Unremarkable HEART:  PMI not displaced or sustained,S1 and S2 within normal limits, no S3, no S4, no clicks, no rubs, no murmurs ABD:  Flat, positive bowel sounds normal in frequency in pitch, no bruits, no rebound, no guarding, no midline pulsatile mass, no hepatomegaly, no splenomegaly EXT:  2 plus pulses throughout, no edema, no cyanosis no clubbing SKIN:  No rashes no nodules NEURO:  Cranial nerves II through XII grossly intact, motor grossly intact throughout PSYCH:  Cognitively intact, oriented to person place and time  ASSESSMENT AND PLAN

## 2011-07-25 NOTE — Patient Instructions (Addendum)
Please start Nifedipine 30 mg  day. Stop Aldomet. The current medical regimen is effective;  continue present plan and medications.  Call in 2 weeks to let us know about your blood pressure.  Follow up in 4 months.

## 2011-07-25 NOTE — Assessment & Plan Note (Signed)
Her blood pressures not controlled. She will stop the Aldomet. I will have her start nifedipine XL 30 mg daily. This is approved by the [employer] of [employer] for nursing mothers.  She will keep a blood pressure diary.  Further adjustments we based on future readings.Marland Kitchen

## 2011-09-30 IMAGING — RF DG HYSTEROGRAM
5 series · 5 of 5 positions shown · non-contrast
Comparison: none

CLINICAL DATA: Recurrent miscarriages

HYSTEROSALPINGOGRAM
TECHNIQUE: Hysterosalpingogram was performed by the ordering
physician under fluoroscopy.  Fluoroscopic images are submitted for
interpretation following the procedure.
Fluoroscopy Time:  1.3 minutes.

[Series 1: run · 1 of 1 slices shown (1 of 5)]
[im 1/1]
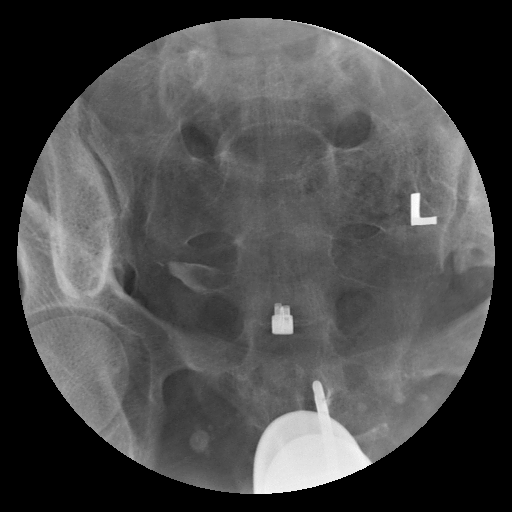

[Series 2: run · 1 of 1 slices shown (2 of 5)]
[im 1/1]
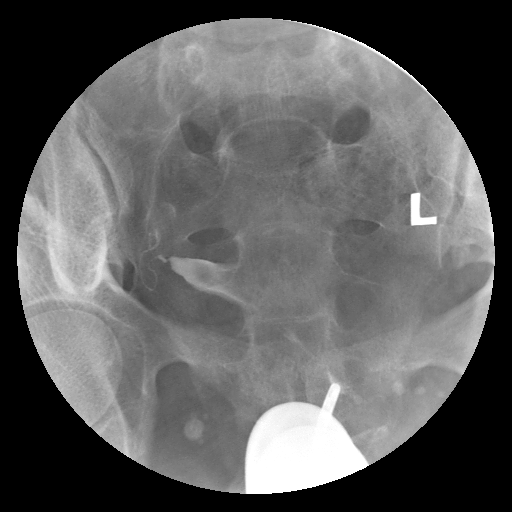

[Series 3: run · 1 of 1 slices shown (3 of 5)]
[im 1/1]
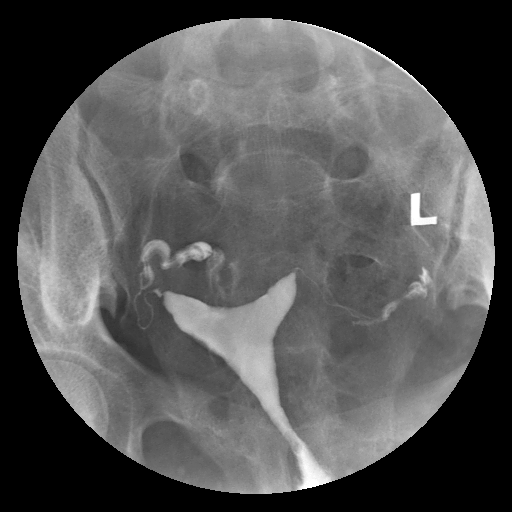

[Series 4: run · 1 of 1 slices shown (4 of 5)]
[im 1/1]
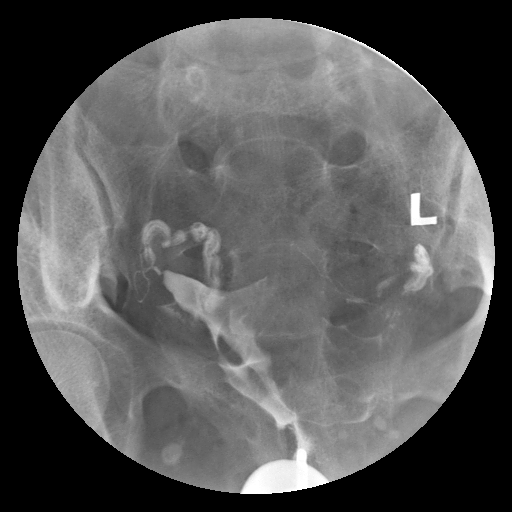

[Series 5: run · 1 of 1 slices shown (5 of 5)]
[im 1/1]
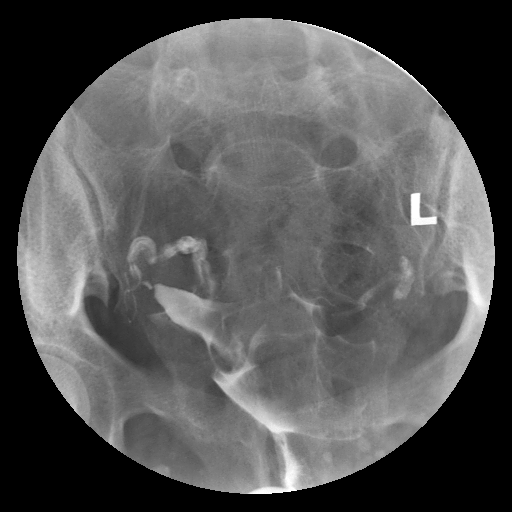

[5 of 5 positions shown; findings below may reference images not displayed]

FINDINGS: A normal endometrial morphology is seen.  Both fallopian
tubes have a normal morphology and bilateral intraperitoneal spill
is seen.  No evidence for loculation of contrast is noted to
suggest the presence of peritubal or periovarian adhesions.
IMPRESSION: Normal HSG

60 ml of Omnipaque 300% was utilized for this exam

## 2011-12-27 ENCOUNTER — Ambulatory Visit: Payer: PRIVATE HEALTH INSURANCE | Admitting: Cardiology

## 2012-04-30 ENCOUNTER — Other Ambulatory Visit: Payer: Self-pay | Admitting: Cardiology

## 2013-03-13 ENCOUNTER — Encounter: Payer: BC Managed Care – PPO | Attending: Obstetrics and Gynecology

## 2013-03-13 VITALS — Ht 69.0 in | Wt 138.7 lb

## 2013-03-13 DIAGNOSIS — O99810 Abnormal glucose complicating pregnancy: Secondary | ICD-10-CM | POA: Insufficient documentation

## 2013-03-13 DIAGNOSIS — Z713 Dietary counseling and surveillance: Secondary | ICD-10-CM | POA: Insufficient documentation

## 2013-03-18 NOTE — Progress Notes (Signed)
  Patient was seen on 03/15/13 for Gestational Diabetes self-management class at [facility]. The following learning objectives were met by the patient during this course:Patient had GDM with previous pregnancy. After completion of presentation, patient obtained glucometer. She stated shewas most comfortable with the testing process, and left.   States the definition of Gestational Diabetes  States why dietary management is important in controlling blood glucose  Describes the effects of carbohydrates on blood glucose levels  Demonstrates ability to create a balanced meal plan  Demonstrates carbohydrate counting   States the effect of stress and exercise on blood glucose levels  States the importance of limiting caffeine and abstaining from alcohol and smoking  Plan:  Aim for 2 Carb Choices per meal (30 grams) +/- 1 either way for breakfast Aim for 3 Carb Choices per meal (45 grams) +/- 1 either way from lunch and dinner Aim for 1-2 Carbs per snack Begin reading food labels for Total Carbohydrate and sugar grams of foods Consider  increasing your activity level by walking daily as tolerated Begin checking BG before breakfast and 1-2 hours after first bit of breakfast, lunch and dinner after  as directed by MD  Take medication  as directed by MD  Blood glucose monitor given: None already testing  Patient instructed to monitor glucose levels: FBS: 60 - <90 1 hour: <140 2 hour: <120  Patient received the following handouts:  Nutrition Diabetes and Pregnancy  Carbohydrate Counting List  Meal Planning worksheet  Patient will be seen for follow-up as needed.

## 2013-05-07 ENCOUNTER — Encounter (HOSPITAL_COMMUNITY): Payer: BC Managed Care – PPO | Admitting: Anesthesiology

## 2013-05-07 ENCOUNTER — Inpatient Hospital Stay (HOSPITAL_COMMUNITY): Payer: BC Managed Care – PPO | Admitting: Anesthesiology

## 2013-05-07 ENCOUNTER — Encounter (HOSPITAL_COMMUNITY): Payer: Self-pay | Admitting: General Practice

## 2013-05-07 ENCOUNTER — Inpatient Hospital Stay (HOSPITAL_COMMUNITY)
Admission: AD | Admit: 2013-05-07 | Discharge: 2013-05-09 | DRG: 765 | Disposition: A | Discharge status: Home / Self Care | Payer: BC Managed Care – PPO | Source: Ambulatory Visit | Attending: Obstetrics | Admitting: Obstetrics

## 2013-05-07 ENCOUNTER — Encounter (HOSPITAL_COMMUNITY)
Admission: AD | Disposition: A | Discharge status: Home / Self Care | Payer: Self-pay | Source: Ambulatory Visit | Attending: Obstetrics

## 2013-05-07 DIAGNOSIS — O99814 Abnormal glucose complicating childbirth: Secondary | ICD-10-CM | POA: Diagnosis present

## 2013-05-07 DIAGNOSIS — O365990 Maternal care for other known or suspected poor fetal growth, unspecified trimester, not applicable or unspecified: Secondary | ICD-10-CM | POA: Diagnosis present

## 2013-05-07 DIAGNOSIS — O1002 Pre-existing essential hypertension complicating childbirth: Secondary | ICD-10-CM | POA: Diagnosis present

## 2013-05-07 DIAGNOSIS — O321XX0 Maternal care for breech presentation, not applicable or unspecified: Principal | ICD-10-CM | POA: Diagnosis present

## 2013-05-07 DIAGNOSIS — O34219 Maternal care for unspecified type scar from previous cesarean delivery: Secondary | ICD-10-CM | POA: Diagnosis present

## 2013-05-07 DIAGNOSIS — O09529 Supervision of elderly multigravida, unspecified trimester: Secondary | ICD-10-CM | POA: Diagnosis present

## 2013-05-07 LAB — COMPREHENSIVE METABOLIC PANEL
ALT: 18 U/L (ref 0–35)
Alkaline Phosphatase: 175 U/L — ABNORMAL HIGH (ref 39–117)
BUN: 8 mg/dL (ref 6–23)
CO2: 23 mEq/L (ref 19–32)
Chloride: 102 mEq/L (ref 96–112)
Creatinine, Ser: 0.55 mg/dL (ref 0.50–1.10)
GFR calc Af Amer: >90 mL/min (ref >90)
Glucose, Bld: 80 mg/dL (ref 70–99)
Potassium: 4.4 mEq/L (ref 3.7–5.3)
Total Bilirubin: 0.3 mg/dL (ref 0.3–1.2)
Total Protein: 7 g/dL (ref 6.0–8.3)

## 2013-05-07 LAB — CBC
HCT: 40.3 % (ref 36.0–46.0)
Hemoglobin: 14.5 g/dL (ref 12.0–15.0)
MCHC: 36 g/dL (ref 30.0–36.0)
RBC: 4.57 MIL/uL (ref 3.87–5.11)
WBC: 8.8 10*3/uL (ref 4.0–10.5)

## 2013-05-07 LAB — TYPE AND SCREEN
ABO/RH(D): O POS
Antibody Screen: NEGATIVE

## 2013-05-07 LAB — ABO/RH: ABO/RH(D): O POS

## 2013-05-07 SURGERY — Surgical Case
Anesthesia: Spinal | Site: Abdomen

## 2013-05-07 MED ORDER — SENNOSIDES-DOCUSATE SODIUM 8.6-50 MG PO TABS
2.0000 | ORAL_TABLET | ORAL | Status: DC
  Administered 2013-05-08 – 2013-05-09 (×2): 2 via ORAL
  Filled 2013-05-07 (×2): qty 2

## 2013-05-07 MED ORDER — LACTATED RINGERS IV SOLN
INTRAVENOUS | Status: DC
  Administered 2013-05-08: 03:00:00 via INTRAVENOUS

## 2013-05-07 MED ORDER — PHENYLEPHRINE 8 MG IN D5W 100 ML (0.08MG/ML) PREMIX OPTIME
INJECTION | INTRAVENOUS | Status: DC | PRN
  Administered 2013-05-07: 60 ug/min via INTRAVENOUS

## 2013-05-07 MED ORDER — ONDANSETRON HCL 4 MG PO TABS: 4.0000 mg | ORAL_TABLET | ORAL | Status: DC | PRN

## 2013-05-07 MED ORDER — IBUPROFEN 600 MG PO TABS
600.0000 mg | ORAL_TABLET | Freq: Four times a day (QID) | ORAL | Status: DC
  Administered 2013-05-08 – 2013-05-09 (×6): 600 mg via ORAL
  Filled 2013-05-07 (×5): qty 1

## 2013-05-07 MED ORDER — WITCH HAZEL-GLYCERIN EX PADS: 1.0000 "application " | MEDICATED_PAD | CUTANEOUS | Status: DC | PRN

## 2013-05-07 MED ORDER — MEPERIDINE HCL 25 MG/ML IJ SOLN
6.2500 mg | INTRAMUSCULAR | Status: DC | PRN
Start: 2013-05-07 — End: 2013-05-07

## 2013-05-07 MED ORDER — FENTANYL CITRATE 0.05 MG/ML IJ SOLN
INTRAMUSCULAR | Status: AC
  Administered 2013-05-07: 50 ug via INTRAVENOUS
  Filled 2013-05-07: qty 2

## 2013-05-07 MED ORDER — METHYLDOPA 500 MG PO TABS
500.0000 mg | ORAL_TABLET | Freq: Two times a day (BID) | ORAL | Status: DC
  Administered 2013-05-07 – 2013-05-09 (×4): 500 mg via ORAL
  Filled 2013-05-07 (×6): qty 1

## 2013-05-07 MED ORDER — DIPHENHYDRAMINE HCL 25 MG PO CAPS: 25.0000 mg | ORAL_CAPSULE | Freq: Four times a day (QID) | ORAL | Status: DC | PRN

## 2013-05-07 MED ORDER — SIMETHICONE 80 MG PO CHEW: 80.0000 mg | CHEWABLE_TABLET | ORAL | Status: DC | PRN

## 2013-05-07 MED ORDER — OXYTOCIN 10 UNIT/ML IJ SOLN
INTRAMUSCULAR | Status: AC
  Filled 2013-05-07: qty 4

## 2013-05-07 MED ORDER — NALOXONE HCL 1 MG/ML IJ SOLN: 1.0000 ug/kg/h | INTRAMUSCULAR | Status: DC | PRN

## 2013-05-07 MED ORDER — NALOXONE HCL 0.4 MG/ML IJ SOLN: 0.4000 mg | INTRAMUSCULAR | Status: DC | PRN

## 2013-05-07 MED ORDER — FAMOTIDINE IN NACL 20-0.9 MG/50ML-% IV SOLN
20.0000 mg | Freq: Once | INTRAVENOUS | Status: AC
  Administered 2013-05-07: 20 mg via INTRAVENOUS
  Filled 2013-05-07: qty 50

## 2013-05-07 MED ORDER — CITRIC ACID-SODIUM CITRATE 334-500 MG/5ML PO SOLN
30.0000 mL | Freq: Once | ORAL | Status: AC
  Administered 2013-05-07: 30 mL via ORAL
  Filled 2013-05-07: qty 15

## 2013-05-07 MED ORDER — KETOROLAC TROMETHAMINE 30 MG/ML IJ SOLN
30.0000 mg | Freq: Four times a day (QID) | INTRAMUSCULAR | Status: DC | PRN
  Administered 2013-05-07: 30 mg via INTRAVENOUS

## 2013-05-07 MED ORDER — METOCLOPRAMIDE HCL 5 MG/ML IJ SOLN: 10.0000 mg | Freq: Three times a day (TID) | INTRAMUSCULAR | Status: DC | PRN

## 2013-05-07 MED ORDER — MENTHOL 3 MG MT LOZG: 1.0000 | LOZENGE | OROMUCOSAL | Status: DC | PRN

## 2013-05-07 MED ORDER — NALBUPHINE HCL 10 MG/ML IJ SOLN: 5.0000 mg | INTRAMUSCULAR | Status: DC | PRN

## 2013-05-07 MED ORDER — ONDANSETRON HCL 4 MG/2ML IJ SOLN
INTRAMUSCULAR | Status: AC
  Filled 2013-05-07: qty 2

## 2013-05-07 MED ORDER — ACETAMINOPHEN 500 MG PO TABS
1000.0000 mg | ORAL_TABLET | Freq: Four times a day (QID) | ORAL | Status: AC
  Administered 2013-05-08 (×3): 1000 mg via ORAL
  Filled 2013-05-07 (×3): qty 2

## 2013-05-07 MED ORDER — KETOROLAC TROMETHAMINE 30 MG/ML IJ SOLN
INTRAMUSCULAR | Status: AC
  Filled 2013-05-07: qty 1

## 2013-05-07 MED ORDER — MORPHINE SULFATE 0.5 MG/ML IJ SOLN
INTRAMUSCULAR | Status: AC
  Filled 2013-05-07: qty 10

## 2013-05-07 MED ORDER — OXYTOCIN 40 UNITS IN LACTATED RINGERS INFUSION - SIMPLE MED: 62.5000 mL/h | INTRAVENOUS | Status: AC

## 2013-05-07 MED ORDER — IBUPROFEN 600 MG PO TABS: 600.0000 mg | ORAL_TABLET | Freq: Four times a day (QID) | ORAL | Status: DC | PRN

## 2013-05-07 MED ORDER — TETANUS-DIPHTH-ACELL PERTUSSIS 5-2.5-18.5 LF-MCG/0.5 IM SUSP: 0.5000 mL | Freq: Once | INTRAMUSCULAR | Status: DC

## 2013-05-07 MED ORDER — CEFAZOLIN SODIUM-DEXTROSE 2-3 GM-% IV SOLR
2.0000 g | INTRAVENOUS | Status: AC
  Administered 2013-05-07: 2 g via INTRAVENOUS
  Filled 2013-05-07: qty 50

## 2013-05-07 MED ORDER — FENTANYL CITRATE 0.05 MG/ML IJ SOLN
INTRAMUSCULAR | Status: DC | PRN
  Administered 2013-05-07: 25 ug via INTRATHECAL

## 2013-05-07 MED ORDER — OXYCODONE-ACETAMINOPHEN 5-325 MG PO TABS
1.0000 | ORAL_TABLET | ORAL | Status: DC | PRN
  Administered 2013-05-08 – 2013-05-09 (×2): 1 via ORAL
  Filled 2013-05-07 (×2): qty 1

## 2013-05-07 MED ORDER — METHYLERGONOVINE MALEATE 0.2 MG PO TABS: 0.2000 mg | ORAL_TABLET | ORAL | Status: DC | PRN

## 2013-05-07 MED ORDER — LACTATED RINGERS IV SOLN
INTRAVENOUS | Status: DC
  Administered 2013-05-07 (×2): via INTRAVENOUS

## 2013-05-07 MED ORDER — SODIUM CHLORIDE 0.9 % IJ SOLN: 3.0000 mL | INTRAMUSCULAR | Status: DC | PRN

## 2013-05-07 MED ORDER — DIPHENHYDRAMINE HCL 25 MG PO CAPS
25.0000 mg | ORAL_CAPSULE | ORAL | Status: DC | PRN
  Filled 2013-05-07: qty 1

## 2013-05-07 MED ORDER — ONDANSETRON HCL 4 MG/2ML IJ SOLN
INTRAMUSCULAR | Status: DC | PRN
  Administered 2013-05-07: 4 mg via INTRAVENOUS

## 2013-05-07 MED ORDER — FENTANYL CITRATE 0.05 MG/ML IJ SOLN
INTRAMUSCULAR | Status: AC
  Filled 2013-05-07: qty 2

## 2013-05-07 MED ORDER — MEPERIDINE HCL 25 MG/ML IJ SOLN: 6.2500 mg | INTRAMUSCULAR | Status: DC | PRN

## 2013-05-07 MED ORDER — DIBUCAINE 1 % RE OINT: 1.0000 "application " | TOPICAL_OINTMENT | RECTAL | Status: DC | PRN

## 2013-05-07 MED ORDER — PROMETHAZINE HCL 25 MG/ML IJ SOLN
INTRAMUSCULAR | Status: AC
  Filled 2013-05-07: qty 1

## 2013-05-07 MED ORDER — BUPIVACAINE HCL (PF) 0.25 % IJ SOLN
INTRAMUSCULAR | Status: DC | PRN
  Administered 2013-05-07: 10 mL

## 2013-05-07 MED ORDER — DIPHENHYDRAMINE HCL 50 MG/ML IJ SOLN: 25.0000 mg | INTRAMUSCULAR | Status: DC | PRN

## 2013-05-07 MED ORDER — ZOLPIDEM TARTRATE 5 MG PO TABS: 5.0000 mg | ORAL_TABLET | Freq: Every evening | ORAL | Status: DC | PRN

## 2013-05-07 MED ORDER — DIPHENHYDRAMINE HCL 50 MG/ML IJ SOLN: 12.5000 mg | INTRAMUSCULAR | Status: DC | PRN

## 2013-05-07 MED ORDER — PRENATAL MULTIVITAMIN CH
1.0000 | ORAL_TABLET | Freq: Every day | ORAL | Status: DC
  Administered 2013-05-08 – 2013-05-09 (×2): 1 via ORAL
  Filled 2013-05-07 (×2): qty 1

## 2013-05-07 MED ORDER — ONDANSETRON HCL 4 MG/2ML IJ SOLN: 4.0000 mg | INTRAMUSCULAR | Status: DC | PRN

## 2013-05-07 MED ORDER — MORPHINE SULFATE (PF) 0.5 MG/ML IJ SOLN
INTRAMUSCULAR | Status: DC | PRN
  Administered 2013-05-07: .15 mg via INTRATHECAL

## 2013-05-07 MED ORDER — KETOROLAC TROMETHAMINE 30 MG/ML IJ SOLN: 30.0000 mg | Freq: Four times a day (QID) | INTRAMUSCULAR | Status: DC | PRN

## 2013-05-07 MED ORDER — BUPIVACAINE IN DEXTROSE 0.75-8.25 % IT SOLN
INTRATHECAL | Status: DC | PRN
  Administered 2013-05-07: 1.2 mL via INTRATHECAL

## 2013-05-07 MED ORDER — METFORMIN HCL ER 750 MG PO TB24
750.0000 mg | ORAL_TABLET | Freq: Every day | ORAL | Status: DC
  Administered 2013-05-07 – 2013-05-08 (×2): 750 mg via ORAL
  Filled 2013-05-07 (×3): qty 1

## 2013-05-07 MED ORDER — SIMETHICONE 80 MG PO CHEW
80.0000 mg | CHEWABLE_TABLET | ORAL | Status: DC
  Administered 2013-05-08: 80 mg via ORAL
  Filled 2013-05-07 (×2): qty 1

## 2013-05-07 MED ORDER — PHENYLEPHRINE 8 MG IN D5W 100 ML (0.08MG/ML) PREMIX OPTIME
INJECTION | INTRAVENOUS | Status: AC
  Filled 2013-05-07: qty 100

## 2013-05-07 MED ORDER — METHYLERGONOVINE MALEATE 0.2 MG/ML IJ SOLN: 0.2000 mg | INTRAMUSCULAR | Status: DC | PRN

## 2013-05-07 MED ORDER — ONDANSETRON HCL 4 MG/2ML IJ SOLN: 4.0000 mg | Freq: Three times a day (TID) | INTRAMUSCULAR | Status: DC | PRN

## 2013-05-07 MED ORDER — SIMETHICONE 80 MG PO CHEW
80.0000 mg | CHEWABLE_TABLET | Freq: Three times a day (TID) | ORAL | Status: DC
  Administered 2013-05-08 – 2013-05-09 (×6): 80 mg via ORAL
  Filled 2013-05-07 (×5): qty 1

## 2013-05-07 MED ORDER — SCOPOLAMINE 1 MG/3DAYS TD PT72
MEDICATED_PATCH | TRANSDERMAL | Status: AC
  Filled 2013-05-07: qty 1

## 2013-05-07 MED ORDER — PROMETHAZINE HCL 25 MG/ML IJ SOLN
6.2500 mg | INTRAMUSCULAR | Status: DC | PRN
  Administered 2013-05-07: 6.25 mg via INTRAVENOUS

## 2013-05-07 MED ORDER — LANOLIN HYDROUS EX OINT: 1.0000 "application " | TOPICAL_OINTMENT | CUTANEOUS | Status: DC | PRN

## 2013-05-07 MED ORDER — MIDAZOLAM HCL 2 MG/2ML IJ SOLN: 0.5000 mg | Freq: Once | INTRAMUSCULAR | Status: DC | PRN

## 2013-05-07 MED ORDER — FENTANYL CITRATE 0.05 MG/ML IJ SOLN
25.0000 ug | INTRAMUSCULAR | Status: DC | PRN
  Administered 2013-05-07 (×2): 50 ug via INTRAVENOUS

## 2013-05-07 MED ORDER — SCOPOLAMINE 1 MG/3DAYS TD PT72
1.0000 | MEDICATED_PATCH | Freq: Once | TRANSDERMAL | Status: DC
  Administered 2013-05-07: 1.5 mg via TRANSDERMAL

## 2013-05-07 SURGICAL SUPPLY — 33 items
CLAMP CORD UMBIL (MISCELLANEOUS) IMPLANT
CLOTH BEACON ORANGE TIMEOUT ST (SAFETY) ×2 IMPLANT
CONTAINER PREFILL 10% NBF 15ML (MISCELLANEOUS) IMPLANT
DRAPE LG THREE QUARTER DISP (DRAPES) IMPLANT
DRSG OPSITE POSTOP 4X10 (GAUZE/BANDAGES/DRESSINGS) ×2 IMPLANT
DRSG TELFA 3X8 NADH (GAUZE/BANDAGES/DRESSINGS) ×2 IMPLANT
DURAPREP 26ML APPLICATOR (WOUND CARE) ×2 IMPLANT
ELECT REM PT RETURN 9FT ADLT (ELECTROSURGICAL) ×2
ELECTRODE REM PT RTRN 9FT ADLT (ELECTROSURGICAL) ×1 IMPLANT
EXTRACTOR VACUUM M CUP 4 TUBE (SUCTIONS) IMPLANT
GLOVE BIO SURGEON STRL SZ7.5 (GLOVE) ×2 IMPLANT
GOWN PREVENTION PLUS XLARGE (GOWN DISPOSABLE) ×2 IMPLANT
GOWN STRL REIN XL XLG (GOWN DISPOSABLE) ×2 IMPLANT
KIT ABG SYR 3ML LUER SLIP (SYRINGE) IMPLANT
NEEDLE HYPO 25X1 1.5 SAFETY (NEEDLE) ×2 IMPLANT
NEEDLE HYPO 25X5/8 SAFETYGLIDE (NEEDLE) IMPLANT
NS IRRIG 1000ML POUR BTL (IV SOLUTION) ×2 IMPLANT
PACK C SECTION WH (CUSTOM PROCEDURE TRAY) ×2 IMPLANT
PAD ABD 7.5X8 STRL (GAUZE/BANDAGES/DRESSINGS) ×2 IMPLANT
SPONGE GAUZE 4X4 12PLY STER LF (GAUZE/BANDAGES/DRESSINGS) ×2 IMPLANT
STAPLER VISISTAT 35W (STAPLE) ×2 IMPLANT
SUT MNCRL 0 VIOLET CTX 36 (SUTURE) ×2 IMPLANT
SUT MON AB 2-0 CT1 27 (SUTURE) ×2 IMPLANT
SUT MON AB-0 CT1 36 (SUTURE) ×4 IMPLANT
SUT MONOCRYL 0 CTX 36 (SUTURE) ×2
SUT PLAIN 0 NONE (SUTURE) IMPLANT
SUT PLAIN 2 0 (SUTURE) ×1
SUT PLAIN 2 0 XLH (SUTURE) IMPLANT
SUT PLAIN ABS 2-0 CT1 27XMFL (SUTURE) ×1 IMPLANT
SYR CONTROL 10ML LL (SYRINGE) ×2 IMPLANT
TOWEL OR 17X24 6PK STRL BLUE (TOWEL DISPOSABLE) ×2 IMPLANT
TRAY FOLEY CATH 14FR (SET/KITS/TRAYS/PACK) ×2 IMPLANT
WATER STERILE IRR 1000ML POUR (IV SOLUTION) ×2 IMPLANT

## 2013-05-07 NOTE — Progress Notes (Signed)
CBC    Component Value Date/Time   WBC 8.8 05/07/2013 1613   RBC 4.57 05/07/2013 1613   HGB 14.5 05/07/2013 1613   HCT 40.3 05/07/2013 1613   PLT 200 05/07/2013 1613   MCV 88.2 05/07/2013 1613   MCH 31.7 05/07/2013 1613   MCHC 36.0 05/07/2013 1613   RDW 14.5 05/07/2013 1613    Type and screen pending. Risks of proceeding at this time are minimal

## 2013-05-07 NOTE — Anesthesia Procedure Notes (Signed)
Spinal  Patient location during procedure: OR Start time: 05/07/2013 5:25 PM Staffing Anesthesiologist: Brayton Caves Performed by: anesthesiologist  Preanesthetic Checklist Completed: patient identified, site marked, surgical consent, pre-op evaluation, timeout performed, IV checked, risks and benefits discussed and monitors and equipment checked Spinal Block Patient position: sitting Prep: DuraPrep Patient monitoring: heart rate, cardiac monitor, continuous pulse ox and blood pressure Approach: midline Location: L3-4 Injection technique: single-shot Needle Needle type: Sprotte  Needle gauge: 24 G Needle length: 9 cm Assessment Sensory level: T4 Additional Notes Patient identified.  Risk benefits discussed including failed block, incomplete pain control, headache, nerve damage, paralysis, blood pressure changes, nausea, vomiting, reactions to medication both toxic or allergic, and postpartum back pain.  Patient expressed understanding and wished to proceed.  All questions were answered.  Sterile technique used throughout procedure.  CSF was clear.  No parasthesia or other complications.  Please see nursing notes for vital signs.

## 2013-05-07 NOTE — Op Note (Signed)
Cesarean Section Procedure Note  Indications: malpresentation: breech, previous uterine incision kerr x one and IUGR, CHTN , A2DM  Pre-operative Diagnosis: 38 week 4 day pregnancy.  Post-operative Diagnosis: same  Surgeon: Lenoard Aden   Assistants: Denyse Amass, CNM  Anesthesia: Local anesthesia 0.25.% bupivacaine and Spinal anesthesia  ASA Class: 2  Procedure Details  The patient was seen in the Holding Room. The risks, benefits, complications, treatment options, and expected outcomes were discussed with the patient.  The patient concurred with the proposed plan, giving informed consent. The risks of anesthesia, infection, bleeding and possible injury to other organs discussed. Injury to bowel, bladder, or ureter with possible need for repair discussed. Possible need for transfusion with secondary risks of hepatitis or HIV acquisition discussed. Post operative complications to include but not limited to DVT, PE and Pneumonia noted. The site of surgery properly noted/marked. The patient was taken to Operating Room # 9, identified as Alyssa Dennis and the procedure verified as C-Section Delivery. A Time Out was held and the above information confirmed.  After induction of anesthesia, the patient was draped and prepped in the usual sterile manner. A Pfannenstiel incision was made and carried down through the subcutaneous tissue to the fascia. Fascial incision was made and extended transversely using Mayo scissors. The fascia was separated from the underlying rectus tissue superiorly and inferiorly. The peritoneum was identified and entered. Peritoneal incision was extended longitudinally. The utero-vesical peritoneal reflection was incised transversely and the bladder flap was bluntly freed from the lower uterine segment. A low transverse uterine incision(Kerr hysterotomy) was made. Delivered from Transverse presentation was a  female with Apgar scores of 9 at one minute and 9 at five  minutes. Bulb suctioning gently performed. Neonatal team in attendance.After the umbilical cord was clamped and cut cord blood was obtained for evaluation. The placenta was removed intact and appeared normal. The uterus was curetted with a dry lap pack. Good hemostasis was noted.The uterine outline, tubes and ovaries appeared normal. The uterine incision was closed with running locked sutures of 0 Monocryl x 2 layers. Hemostasis was observed. Lavage was carried out until clear.The parietal peritoneum was closed with a running 2-0 Monocryl suture. The fascia was then reapproximated with running sutures of 0 Monocryl. The skin was reapproximated with staples.  Instrument, sponge, and needle counts were correct prior the abdominal closure and at the conclusion of the case.   Findings: As noted above  Estimated Blood Loss:  300 mL         Drains: foley                 Specimens: placenta                 Complications:  None; patient tolerated the procedure well.         Disposition: PACU - hemodynamically stable.         Condition: stable  Attending Attestation: I performed the procedure.

## 2013-05-07 NOTE — Progress Notes (Signed)
Patient seen and examined. Consent witnessed and signed. No changes noted. Update completed. 

## 2013-05-07 NOTE — Anesthesia Preprocedure Evaluation (Signed)
Anesthesia Evaluation  Patient identified by MRN, date of birth, ID band Patient awake    Reviewed: Allergy & Precautions, H&P , NPO status , Patient's Chart, lab work & pertinent test results  Airway Mallampati: II      Dental   Pulmonary  breath sounds clear to auscultation        Cardiovascular Exercise Tolerance: Good hypertension, Rhythm:regular Rate:Normal     Neuro/Psych    GI/Hepatic   Endo/Other  diabetes  Renal/GU      Musculoskeletal   Abdominal   Peds  Hematology   Anesthesia Other Findings   Reproductive/Obstetrics (+) Pregnancy                           Anesthesia Physical Anesthesia Plan  ASA: III and emergent  Anesthesia Plan: Spinal   Post-op Pain Management:    Induction:   Airway Management Planned:   Additional Equipment:   Intra-op Plan:   Post-operative Plan:   Informed Consent: I have reviewed the patients History and Physical, chart, labs and discussed the procedure including the risks, benefits and alternatives for the proposed anesthesia with the patient or authorized representative who has indicated his/her understanding and acceptance.     Plan Discussed with: Anesthesiologist, CRNA and Surgeon  Anesthesia Plan Comments:         Anesthesia Quick Evaluation

## 2013-05-07 NOTE — MAU Note (Signed)
Pt presents to MAU for add on repeat c-section

## 2013-05-07 NOTE — Transfer of Care (Signed)
Immediate Anesthesia Transfer of Care Note  Patient: Alyssa Dennis  Procedure(s) Performed: Procedure(s): CESAREAN SECTION (N/A)  Patient Location: PACU  Anesthesia Type:Spinal  Level of Consciousness: awake, alert  and oriented  Airway & Oxygen Therapy: Patient Spontanous Breathing  Post-op Assessment: Report given to PACU RN and Post -op Vital signs reviewed and stable  Post vital signs: Reviewed and stable  Complications: No apparent anesthesia complications

## 2013-05-07 NOTE — H&P (Signed)
Alyssa Dennis is a 42 y.o. female presenting for repeat csection. Maternal Medical History:  Fetal activity: Perceived fetal activity is normal.      OB History   Grav Para Term Preterm Abortions TAB SAB Ect Mult Living   4 1 1  2  2   1      Past Medical History  Diagnosis Date  . HTN (hypertension)   . Anti-cardiolipin antibody syndrome     Negative per retesting by Dr. Gaylyn Rong  . Miscarriage     x 2  . Uterine cyst   . Infertility, female   . Insulin resistance   . Active labor 05/28/2011  . PP care - s/p 1C/S 1/19, failure to progress 05/30/2011  . Gestational diabetes    Past Surgical History  Procedure Laterality Date  . Dilation and curettage of uterus  July 2010  . Endometrial polyp    . Cesarean section  05/28/2011    Procedure: CESAREAN SECTION;  Surgeon: Lenoard Aden, MD;  Location: [facility];  Service: Gynecology;  Laterality: N/A;  Primary cesarean section with delivery of baby boy at 39. Apgars 9/9.   Family History: family history includes Hypertension in her father and mother. Social History:  reports that she has never smoked. She has never used smokeless tobacco. She reports that she does not drink alcohol or use illicit drugs.   Prenatal Transfer Tool  Maternal Diabetes: Yes:  Diabetes Type:  Insulin/Medication controlled Genetic Screening: Normal Maternal Ultrasounds/Referrals: Abnormal:  Findings:   IUGR Fetal Ultrasounds or other Referrals:  None Maternal Substance Abuse:  No Significant Maternal Medications:  Meds include: Other:  Significant Maternal Lab Results:  None Other Comments:  None  Review of Systems  HENT: Negative.   Eyes: Negative.   Respiratory: Negative.   Cardiovascular: Negative.   Gastrointestinal: Negative.   Genitourinary: Negative.   Musculoskeletal: Negative.   Skin: Negative.   Neurological: Negative.   Endo/Heme/Allergies: Negative.   Psychiatric/Behavioral: Negative.   All other systems reviewed and are  negative.      There were no vitals taken for this visit. Maternal Exam:  Abdomen: Patient reports no abdominal tenderness. Surgical scars: low transverse.   Introitus: Normal vulva. Normal vagina.  Ferning test: not done.  Nitrazine test: not done. Amniotic fluid character: not assessed.  Pelvis: of concern for delivery.      Physical Exam  Vitals reviewed. Constitutional: She is oriented to person, place, and time. She appears well-developed and well-nourished.  Eyes: Conjunctivae are normal. Pupils are equal, round, and reactive to light.  Neck: Normal range of motion.  Cardiovascular: Normal rate and regular rhythm.   Respiratory: Effort normal and breath sounds normal.  GI: Soft. Bowel sounds are normal.  Genitourinary: Vagina normal and uterus normal.  Musculoskeletal: Normal range of motion.  Neurological: She is alert and oriented to person, place, and time.  Skin: Skin is warm and dry.  Psychiatric: She has a normal mood and affect. Her behavior is normal. Judgment and thought content normal.    Prenatal labs: ABO, Rh:   Antibody:   Rubella:   RPR:    HBsAg:    HIV:    GBS:     Assessment/Plan: 38+  Weeks IUGR Breech A2DM well controlled on Metformin CHTN stable on meds Previous csec Proceed with repeat csection Risks vs benefits discussed.   Alyssa Dennis J 05/07/2013, 3:37 PM

## 2013-05-08 ENCOUNTER — Encounter (HOSPITAL_COMMUNITY): Payer: Self-pay | Admitting: Obstetrics and Gynecology

## 2013-05-08 LAB — CBC
Hemoglobin: 11.4 g/dL — ABNORMAL LOW (ref 12.0–15.0)
MCH: 30.6 pg (ref 26.0–34.0)
MCHC: 35 g/dL (ref 30.0–36.0)
Platelets: 168 10*3/uL (ref 150–400)
RBC: 3.72 MIL/uL — ABNORMAL LOW (ref 3.87–5.11)

## 2013-05-08 MED ORDER — LACTATED RINGERS IV BOLUS (SEPSIS)
500.0000 mL | Freq: Once | INTRAVENOUS | Status: AC
  Administered 2013-05-08: 500 mL via INTRAVENOUS

## 2013-05-08 NOTE — Anesthesia Postprocedure Evaluation (Signed)
  Anesthesia Post-op Note  Patient: Alyssa Dennis  Procedure(s) Performed: Procedure(s): CESAREAN SECTION (N/A)  Patient Location: Mother/Baby  Anesthesia Type:Spinal  Level of Consciousness: awake  Airway and Oxygen Therapy: Patient Spontanous Breathing  Post-op Pain: none  Post-op Assessment: Patient's Cardiovascular Status Stable, Respiratory Function Stable, Patent Airway, No signs of Nausea or vomiting, Adequate PO intake, Pain level controlled, No headache, No backache, No residual numbness and No residual motor weakness  Post-op Vital Signs: Reviewed and stable  Complications: No apparent anesthesia complications

## 2013-05-08 NOTE — Progress Notes (Addendum)
POSTOPERATIVE DAY # 1 S/P CS   S:         Reports feeling tired and sore             Tolerating po intake / no nausea / no vomiting / no flatus / no BM             Bleeding is light             Pain controlled with long-acting narcotic and motrin             Up ad lib / ambulatory/ voiding QS  Newborn breast feeding   O:  VS: BP 109/68  Pulse 73  Temp(Src) 98.4 F (36.9 C) (Oral)  Resp 18  Ht 4' 11.06" (1.5 m)  Wt 62.959 kg (138 lb 12.8 oz)  BMI 27.98 kg/m2  SpO2 97%   LABS:               Recent Labs  05/07/13 1613 05/08/13 0555  WBC 8.8 10.9*  HGB 14.5 11.4*  PLT 200 168               Bloodtype: --/--/O POS, O POS (12/30 1613)  Rubella:                                               I&O: Intake/Output     12/30 0701 - 12/31 0700 12/31 0701 - 01/01 0700   P.O. 720    I.V. (mL/kg) 1000 (15.9)    IV Piggyback 500    Total Intake(mL/kg) 2220 (35.3)    Urine (mL/kg/hr) 455    Blood 700    Total Output 1155     Net +1065                       Physical Exam:             Alert and Oriented X3  Lungs: Clear and unlabored  Heart: regular rate and rhythm / no mumurs  Abdomen: soft, non-tender, non-distended, hypoactive BS             Fundus: firm, non-tender, Ueven             Dressing intact pressure dressing              Lochia: light  Extremities: trace edema, no calf pain or tenderness, SCD in place  A:        POD # 1 S/P CS            Chronic hypertension - stable Aldomet 500mg  BID            GDMa2 - delivered             P:        Routine postoperative care              Advance post-op activity / progression towards  dc     [name] CNM, MSN, FACNM 05/08/2013, 10:58 AM

## 2013-05-08 NOTE — Progress Notes (Signed)
Patient with output of 50ml since 2330. Telephone report to Dr. Billy Coast. Orders received for LR bolus.

## 2013-05-08 NOTE — Lactation Note (Signed)
This note was copied from the chart of Alyssa Hanny Santillanes. Lactation Consultation Note  Patient Name: Alyssa Dennis [id number] Date: 05/08/2013 Reason for consult: Follow-up assessment to determine if #30 breast flanges were proper fit.  LC assessed mom's nipples and provided (2) flanges for use with her personal pump.  LC encouraged breastfeeding ad lib per baby's feeding cues (at least every 3 hours due to baby's weight <6 pounds) and both mom and RN, Marcelino Duster report baby latching well with strong sucking bursts and audible swallows.   Maternal Data    Feeding Feeding Type: Breast Fed Length of feed: 20 min  LATCH Score/Interventions Latch: Grasps breast easily, tongue down, lips flanged, rhythmical sucking.  Audible Swallowing: Spontaneous and intermittent Intervention(s): Skin to skin  Type of Nipple: Everted at rest and after stimulation  Comfort (Breast/Nipple): Soft / non-tender     Hold (Positioning): Assistance needed to correctly position infant at breast and maintain latch. Intervention(s): Support Pillows  LATCH Score: 9  (previous feeding per RN assessment)  Lactation Tools Discussed/Used Tools: Flanges Flange Size: 30 (2 #30 flanges provided based on mom's nipple size) Cue feedings at breast and only pump if baby unable to latch or supplement needed  Consult Status Consult Status: Follow-up Date: 05/09/13 Follow-up type: In-patient    [facility] 05/08/2013, 9:21 PM

## 2013-05-09 MED ORDER — OXYCODONE-ACETAMINOPHEN 5-325 MG PO TABS: 1.0000 | ORAL_TABLET | ORAL | Status: AC | PRN

## 2013-05-09 MED ORDER — IBUPROFEN 600 MG PO TABS: 600.0000 mg | ORAL_TABLET | Freq: Four times a day (QID) | ORAL | Status: AC

## 2013-05-09 NOTE — Progress Notes (Signed)
POSTOPERATIVE DAY # 2 S/P CS   S:         Reports feeling well - wants to go home             Tolerating po intake / no nausea / no vomiting / + flatus / no BM             Bleeding is light             Pain controlled with motrin and percocet             Up ad lib / ambulatory/ voiding QS  Newborn breast feeding     O:  VS: BP 131/80  Pulse 91  Temp(Src) 98.4 F (36.9 C) (Oral)  Resp 18  Ht 4' 11.06" (1.5 m)  Wt 62.959 kg (138 lb 12.8 oz)  BMI 27.98 kg/m2  SpO2 98%   LABS:               Recent Labs  05/07/13 1613 05/08/13 0555  WBC 8.8 10.9*  HGB 14.5 11.4*  PLT 200 168                                         I&O:  Net - 475             Physical Exam:             Alert and Oriented X3  Lungs: Clear and unlabored  Heart: regular rate and rhythm / no mumurs  Abdomen: soft, non-tender, non-distended active BS             Fundus: firm, non-tender, U-1             Dressing OFF              Incision:  approximated with staples / no erythema / no ecchymosis / no drainage  Perineum: intact  Lochia: light  Extremities: trace edema, no calf pain or tenderness, negative Homans  A:        POD # 2 S/P CS            Chronic hypertension - stable on Aldomet            GDMa2 - delivered             P:        Routine postoperative care              DC home              [facility] booklet - instructions reviewed             Office visit at [facility] Monday - BP check / staple removal    Alyssa Dennis, Alyssa Dennis CNM, MSN, FACNM 05/09/2013, 11:13 AM

## 2013-05-09 NOTE — Discharge Summary (Signed)
POSTOPERATIVE DISCHARGE SUMMARY:  Patient ID: Alyssa Dennis MRN: [id number] DOB/AGE: [birth year] 43 y.o.  Admit date: 05/07/2013 Admission Diagnoses: 38.4 weeks / breech / IUGR / chronic hypertension / GDMa2  Discharge date:  05/09/2013 Discharge Diagnoses: POD 2 s/p CS / chronic hypertension / GDMa2  Prenatal history: [id number]   EDC : 05/17/2013, by Other Basis  [facility]renatal care at [facility]  Primary provider: Taavon Prenatal course complicated by AMA / ACA syndrome /chronic hypertension / PCOS / GDMa2 / IUGR / breech/ previous cesarean section  Prenatal Labs: ABO, Rh: --/--/O POS, O POS (12/30 1613)  Antibody: NEG (12/30 1613) Rubella:   Immune RPR: NON REACTIVE (12/30 1613)  HBsAg:   NR HIV:   NR GTT : ABNORMAL GBS: negative  Medical / Surgical History :  Past medical history:  Past Medical History  Diagnosis Date  . HTN (hypertension)   . Anti-cardiolipin antibody syndrome     Negative per retesting by Dr. Gaylyn Rong  . Miscarriage     x 2  . Uterine cyst   . Infertility, female   . Insulin resistance   . Active labor 05/28/2011  . PP care - s/p 1C/S 1/19, failure to progress 05/30/2011  . Gestational diabetes     Past surgical history:  Past Surgical History  Procedure Laterality Date  . Dilation and curettage of uterus  July 2010  . Endometrial polyp    . Cesarean section  05/28/2011    Procedure: CESAREAN SECTION;  Surgeon: Lenoard Aden, MD;  Location: [facility];  Service: Gynecology;  Laterality: N/A;  Primary cesarean section with delivery of baby boy at 59. Apgars 9/9.  Marland Kitchen Cesarean section N/A 05/07/2013    Procedure: CESAREAN SECTION;  Surgeon: Lenoard Aden, MD;  Location: [facility];  Service: Obstetrics;  Laterality: N/A;    Family History:  Family History  Problem Relation Age of Onset  . Hypertension Mother   . Hypertension Father     Social History:  reports that she has never smoked. She has never used smokeless tobacco. She  reports that she does not drink alcohol or use illicit drugs.  Allergies: Labetalol   Current Medications at time of admission:  Prior to Admission medications   Medication Sig Start Date End Date Taking? Authorizing Provider  metFORMIN (GLUCOPHAGE-XR) 750 MG 24 hr tablet Take 750 mg by mouth at bedtime.    Yes Historical Provider, MD  methyldopa (ALDOMET) 500 MG tablet Take 500 mg by mouth 2 (two) times daily.   Yes Historical Provider, MD  Prenatal Vit-Fe Fumarate-FA (PRENATAL MULTIVITAMIN) TABS tablet Take 1 tablet by mouth daily at 12 noon.   Yes Historical Provider, MD    Procedures: Cesarean section delivery on 06/07/2012 with delivery of  female newborn by Dr Billy Coast   See operative report for further details APGAR (1 MIN): 8   APGAR (5 MINS): 9    Postoperative / postpartum course:  Uncomplicated with discharge on POD 2  Physical Exam:   VSS: Temp:  [97.8 F (36.6 C)-98.6 F (37 C)] 98.4 F (36.9 C) (01/01 0530) Pulse Rate:  [84-91] 91 (01/01 0530) Resp:  [18-20] 18 (01/01 0530) BP: (129-147)/(79-85) 131/80 mmHg (01/01 0530) SpO2:  [97 %-98 %] 98 % (12/31 1641)  LABS:  Recent Labs  05/07/13 1613 05/08/13 0555  WBC 8.8 10.9*  HGB 14.5 11.4*  PLT 200 168    General: ambulatory/ dressed / NAD or pain Heart: RRR Lungs: clear  Abdomen: soft and  non-tender / non-distended / active BS  Extremities: trace edema / negative Homans  Dressing: OFF Incision:  approximated with staples / no erythema / no ecchymosis / no drainage  Discharge Instructions:  Discharged Condition: stable  Activity: pelvic rest and postoperative restrictions x 2   Diet: low carb and low salt  Medications:    Medication List         ibuprofen 600 MG tablet  Commonly known as:  ADVIL,MOTRIN  Take 1 tablet (600 mg total) by mouth every 6 (six) hours.     metFORMIN 750 MG 24 hr tablet  Commonly known as:  GLUCOPHAGE-XR  Take 750 mg by mouth at bedtime.     methyldopa 500 MG  tablet  Commonly known as:  ALDOMET  Take 500 mg by mouth 2 (two) times daily.     oxyCODONE-acetaminophen 5-325 MG per tablet  Commonly known as:  PERCOCET/ROXICET  Take 1-2 tablets by mouth every 4 (four) hours as needed for severe pain (moderate - severe pain).     prenatal multivitamin Tabs tablet  Take 1 tablet by mouth daily at 12 noon.        Wound Care: keep clean and dry [facility]: [facility] discharge booklet - instructions reviewed  Discharge to: Home  Follow up :  [facility] in 4 days for interval visit with midwife for staple removal / BP recheck [facility] in 6 weeks for routine postpartum visit with Taavon                Signed: Marlinda MikeBAILEY, Ahja Martello CNM, MSN, [facility]NM 05/09/2013, 11:20 AM

## 2014-03-10 ENCOUNTER — Encounter (HOSPITAL_COMMUNITY): Payer: Self-pay | Admitting: Obstetrics and Gynecology
